# Patient Record
Sex: Male | Born: 1980 | Race: White | Hispanic: No
Health system: Western US, Academic
[De-identification: ages and names within clinical notes are randomized; demographics above are authoritative.]

## PROBLEM LIST (undated history)

## (undated) ENCOUNTER — Emergency Department (HOSPITAL_COMMUNITY): Admission: EM | Payer: PRIVATE HEALTH INSURANCE | Source: Home / Self Care

## (undated) DIAGNOSIS — K8689 Other specified diseases of pancreas: Secondary | ICD-10-CM

## (undated) DIAGNOSIS — I4891 Unspecified atrial fibrillation: Secondary | ICD-10-CM

## (undated) DIAGNOSIS — F32A Depression, unspecified: Secondary | ICD-10-CM

## (undated) DIAGNOSIS — F1010 Alcohol abuse, uncomplicated: Secondary | ICD-10-CM

## (undated) DIAGNOSIS — F329 Major depressive disorder, single episode, unspecified: Secondary | ICD-10-CM

## (undated) DIAGNOSIS — F419 Anxiety disorder, unspecified: Secondary | ICD-10-CM

## (undated) DIAGNOSIS — E785 Hyperlipidemia, unspecified: Secondary | ICD-10-CM

## (undated) DIAGNOSIS — G4730 Sleep apnea, unspecified: Secondary | ICD-10-CM

## (undated) DIAGNOSIS — K862 Cyst of pancreas: Secondary | ICD-10-CM

## (undated) DIAGNOSIS — I10 Essential (primary) hypertension: Secondary | ICD-10-CM

## (undated) DIAGNOSIS — M109 Gout, unspecified: Secondary | ICD-10-CM

## (undated) DIAGNOSIS — K863 Pseudocyst of pancreas: Secondary | ICD-10-CM

## (undated) HISTORY — DX: Gout, unspecified: M10.9

## (undated) HISTORY — DX: Essential (primary) hypertension: I10

## (undated) HISTORY — DX: Hyperlipidemia, unspecified: E78.5

## (undated) HISTORY — DX: Major depressive disorder, single episode, unspecified: F32.9

## (undated) HISTORY — DX: Sleep apnea, unspecified: G47.30

## (undated) HISTORY — DX: Depression, unspecified: F32.A

## (undated) HISTORY — PX: COLON SURGERY: SHX602

## (undated) DEATH — deceased

---
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
## 1999-06-30 ENCOUNTER — Encounter: Payer: Self-pay | Admitting: Emergency Medicine

## 1999-06-30 ENCOUNTER — Emergency Department (HOSPITAL_COMMUNITY): Admission: EM | Admit: 1999-06-30 | Discharge: 1999-06-30 | Payer: Self-pay | Admitting: Emergency Medicine

## 2000-12-08 ENCOUNTER — Emergency Department (HOSPITAL_COMMUNITY): Admission: EM | Admit: 2000-12-08 | Discharge: 2000-12-09 | Payer: Self-pay | Admitting: Emergency Medicine

## 2001-09-15 ENCOUNTER — Emergency Department (HOSPITAL_COMMUNITY): Admission: EM | Admit: 2001-09-15 | Discharge: 2001-09-15 | Payer: Self-pay

## 2001-09-26 ENCOUNTER — Emergency Department (HOSPITAL_COMMUNITY): Admission: EM | Admit: 2001-09-26 | Discharge: 2001-09-26 | Payer: Self-pay | Admitting: Emergency Medicine

## 2006-02-05 ENCOUNTER — Emergency Department (HOSPITAL_COMMUNITY): Admission: EM | Admit: 2006-02-05 | Discharge: 2006-02-05 | Payer: Self-pay | Admitting: Emergency Medicine

## 2006-10-15 ENCOUNTER — Emergency Department (HOSPITAL_COMMUNITY): Admission: EM | Admit: 2006-10-15 | Discharge: 2006-10-15 | Payer: Self-pay | Admitting: Emergency Medicine

## 2007-05-01 ENCOUNTER — Emergency Department (HOSPITAL_COMMUNITY): Admission: EM | Admit: 2007-05-01 | Discharge: 2007-05-01 | Payer: Self-pay | Admitting: Emergency Medicine

## 2007-06-19 ENCOUNTER — Emergency Department (HOSPITAL_COMMUNITY): Admission: EM | Admit: 2007-06-19 | Discharge: 2007-06-19 | Payer: Self-pay | Admitting: Emergency Medicine

## 2008-02-09 ENCOUNTER — Emergency Department (HOSPITAL_COMMUNITY): Admission: EM | Admit: 2008-02-09 | Discharge: 2008-02-10 | Payer: Self-pay | Admitting: Emergency Medicine

## 2008-08-22 ENCOUNTER — Emergency Department (HOSPITAL_COMMUNITY): Admission: EM | Admit: 2008-08-22 | Discharge: 2008-08-22 | Payer: Self-pay | Admitting: Emergency Medicine

## 2008-10-07 ENCOUNTER — Emergency Department (HOSPITAL_COMMUNITY): Admission: EM | Admit: 2008-10-07 | Discharge: 2008-10-07 | Payer: Self-pay | Admitting: Emergency Medicine

## 2008-11-16 ENCOUNTER — Emergency Department (HOSPITAL_COMMUNITY): Admission: EM | Admit: 2008-11-16 | Discharge: 2008-11-16 | Payer: Self-pay | Admitting: Family Medicine

## 2009-01-08 ENCOUNTER — Emergency Department (HOSPITAL_COMMUNITY): Admission: EM | Admit: 2009-01-08 | Discharge: 2009-01-08 | Payer: Self-pay | Admitting: Emergency Medicine

## 2010-08-08 ENCOUNTER — Ambulatory Visit: Payer: PRIVATE HEALTH INSURANCE | Attending: Otology & Neurotology | Admitting: Otology & Neurotology

## 2010-08-08 DIAGNOSIS — H6990 Unspecified Eustachian tube disorder, unspecified ear: Secondary | ICD-10-CM | POA: Insufficient documentation

## 2010-08-08 DIAGNOSIS — H905 Unspecified sensorineural hearing loss: Secondary | ICD-10-CM | POA: Insufficient documentation

## 2010-09-29 LAB — POCT I-STAT, CHEM 8
Creatinine, Ser: 1.1 mg/dL (ref 0.4–1.5)
HCT: 49 % (ref 39.0–52.0)
Hemoglobin: 16.7 g/dL (ref 13.0–17.0)
Potassium: 3.8 mEq/L (ref 3.5–5.1)
Sodium: 138 mEq/L (ref 135–145)
TCO2: 27 mmol/L (ref 0–100)

## 2010-10-02 LAB — DIFFERENTIAL
Basophils Relative: 0 % (ref 0–1)
Eosinophils Absolute: 0.1 10*3/uL (ref 0.0–0.7)
Eosinophils Relative: 1 % (ref 0–5)
Lymphs Abs: 2.5 10*3/uL (ref 0.7–4.0)
Monocytes Relative: 10 % (ref 3–12)
Neutrophils Relative %: 57 % (ref 43–77)

## 2010-10-02 LAB — COMPREHENSIVE METABOLIC PANEL
ALT: 20 U/L (ref 0–53)
AST: 24 U/L (ref 0–37)
Alkaline Phosphatase: 74 U/L (ref 39–117)
CO2: 28 mEq/L (ref 19–32)
Calcium: 9.4 mg/dL (ref 8.4–10.5)
Chloride: 103 mEq/L (ref 96–112)
GFR calc Af Amer: >60 mL/min (ref >60)
GFR calc non Af Amer: >60 mL/min (ref >60)
Glucose, Bld: 97 mg/dL (ref 70–99)
Potassium: 4.5 mEq/L (ref 3.5–5.1)
Sodium: 139 mEq/L (ref 135–145)

## 2010-10-02 LAB — MAGNESIUM: Magnesium: 2.4 mg/dL (ref 1.5–2.5)

## 2010-10-02 LAB — CBC
Hemoglobin: 16 g/dL (ref 13.0–17.0)
RBC: 5.13 MIL/uL (ref 4.22–5.81)
WBC: 7.8 10*3/uL (ref 4.0–10.5)

## 2010-10-10 ENCOUNTER — Ambulatory Visit (HOSPITAL_BASED_OUTPATIENT_CLINIC_OR_DEPARTMENT_OTHER): Payer: PRIVATE HEALTH INSURANCE

## 2010-10-10 ENCOUNTER — Ambulatory Visit: Payer: PRIVATE HEALTH INSURANCE | Attending: Physician Assistant | Admitting: Physician Assistant

## 2010-10-10 DIAGNOSIS — H6610 Chronic tubotympanic suppurative otitis media, unspecified: Secondary | ICD-10-CM | POA: Insufficient documentation

## 2010-10-10 DIAGNOSIS — Z01818 Encounter for other preprocedural examination: Secondary | ICD-10-CM | POA: Insufficient documentation

## 2010-10-23 ENCOUNTER — Ambulatory Visit
Admission: RE | Admit: 2010-10-23 | Discharge: 2010-10-23 | Disposition: A | Discharge status: Home / Self Care | Payer: PRIVATE HEALTH INSURANCE | Attending: Otology & Neurotology | Admitting: Otology & Neurotology

## 2010-10-23 ENCOUNTER — Ambulatory Visit (HOSPITAL_BASED_OUTPATIENT_CLINIC_OR_DEPARTMENT_OTHER): Payer: PRIVATE HEALTH INSURANCE | Admitting: Otology & Neurotology

## 2010-10-23 DIAGNOSIS — H902 Conductive hearing loss, unspecified: Secondary | ICD-10-CM | POA: Insufficient documentation

## 2010-10-23 DIAGNOSIS — H9500 Recurrent cholesteatoma of postmastoidectomy cavity, unspecified ear: Secondary | ICD-10-CM | POA: Insufficient documentation

## 2010-10-23 DIAGNOSIS — H73829 Atrophic nonflaccid tympanic membrane, unspecified ear: Secondary | ICD-10-CM | POA: Insufficient documentation

## 2010-10-23 DIAGNOSIS — J310 Chronic rhinitis: Secondary | ICD-10-CM | POA: Insufficient documentation

## 2010-10-31 ENCOUNTER — Ambulatory Visit: Payer: PRIVATE HEALTH INSURANCE | Attending: Otology & Neurotology | Admitting: Otology & Neurotology

## 2010-10-31 DIAGNOSIS — H902 Conductive hearing loss, unspecified: Secondary | ICD-10-CM | POA: Insufficient documentation

## 2010-10-31 DIAGNOSIS — H7190 Unspecified cholesteatoma, unspecified ear: Secondary | ICD-10-CM | POA: Insufficient documentation

## 2010-10-31 DIAGNOSIS — Z4801 Encounter for change or removal of surgical wound dressing: Secondary | ICD-10-CM | POA: Insufficient documentation

## 2010-12-19 ENCOUNTER — Ambulatory Visit: Payer: PRIVATE HEALTH INSURANCE | Attending: Otology & Neurotology | Admitting: Otology & Neurotology

## 2010-12-19 DIAGNOSIS — Z09 Encounter for follow-up examination after completed treatment for conditions other than malignant neoplasm: Secondary | ICD-10-CM | POA: Insufficient documentation

## 2010-12-19 DIAGNOSIS — H902 Conductive hearing loss, unspecified: Secondary | ICD-10-CM | POA: Insufficient documentation

## 2010-12-19 DIAGNOSIS — H7190 Unspecified cholesteatoma, unspecified ear: Secondary | ICD-10-CM | POA: Insufficient documentation

## 2011-06-24 DIAGNOSIS — F1010 Alcohol abuse, uncomplicated: Secondary | ICD-10-CM

## 2011-06-24 HISTORY — DX: Alcohol abuse, uncomplicated: F10.10

## 2011-06-27 ENCOUNTER — Ambulatory Visit: Payer: PRIVATE HEALTH INSURANCE | Attending: Otology & Neurotology

## 2011-06-27 DIAGNOSIS — H6500 Acute serous otitis media, unspecified ear: Secondary | ICD-10-CM | POA: Insufficient documentation

## 2011-07-03 ENCOUNTER — Ambulatory Visit: Payer: PRIVATE HEALTH INSURANCE | Attending: Otology & Neurotology | Admitting: Otology & Neurotology

## 2011-07-03 DIAGNOSIS — H7190 Unspecified cholesteatoma, unspecified ear: Secondary | ICD-10-CM | POA: Insufficient documentation

## 2011-07-03 DIAGNOSIS — H6990 Unspecified Eustachian tube disorder, unspecified ear: Secondary | ICD-10-CM | POA: Insufficient documentation

## 2011-07-03 DIAGNOSIS — H66009 Acute suppurative otitis media without spontaneous rupture of ear drum, unspecified ear: Secondary | ICD-10-CM | POA: Insufficient documentation

## 2011-08-18 DIAGNOSIS — F1010 Alcohol abuse, uncomplicated: Secondary | ICD-10-CM | POA: Insufficient documentation

## 2012-01-01 ENCOUNTER — Ambulatory Visit (HOSPITAL_BASED_OUTPATIENT_CLINIC_OR_DEPARTMENT_OTHER): Payer: PRIVATE HEALTH INSURANCE | Admitting: Otology & Neurotology

## 2012-09-16 ENCOUNTER — Ambulatory Visit (HOSPITAL_BASED_OUTPATIENT_CLINIC_OR_DEPARTMENT_OTHER): Payer: PRIVATE HEALTH INSURANCE | Admitting: Audiologist

## 2012-09-16 ENCOUNTER — Ambulatory Visit: Payer: PRIVATE HEALTH INSURANCE | Attending: Audiologist | Admitting: Otology & Neurotology

## 2012-09-16 DIAGNOSIS — H906 Mixed conductive and sensorineural hearing loss, bilateral: Secondary | ICD-10-CM | POA: Insufficient documentation

## 2012-09-16 DIAGNOSIS — H6520 Chronic serous otitis media, unspecified ear: Secondary | ICD-10-CM | POA: Insufficient documentation

## 2012-09-16 DIAGNOSIS — H6990 Unspecified Eustachian tube disorder, unspecified ear: Secondary | ICD-10-CM | POA: Insufficient documentation

## 2012-09-16 DIAGNOSIS — H7190 Unspecified cholesteatoma, unspecified ear: Secondary | ICD-10-CM | POA: Insufficient documentation

## 2012-09-21 DIAGNOSIS — K8689 Other specified diseases of pancreas: Secondary | ICD-10-CM

## 2012-09-21 HISTORY — DX: Other specified diseases of pancreas: K86.89

## 2012-11-04 ENCOUNTER — Encounter (HOSPITAL_BASED_OUTPATIENT_CLINIC_OR_DEPARTMENT_OTHER): Payer: PRIVATE HEALTH INSURANCE | Admitting: Otology & Neurotology

## 2012-11-18 ENCOUNTER — Ambulatory Visit: Payer: PRIVATE HEALTH INSURANCE | Attending: Otology & Neurotology | Admitting: Otology & Neurotology

## 2012-11-18 ENCOUNTER — Encounter (HOSPITAL_BASED_OUTPATIENT_CLINIC_OR_DEPARTMENT_OTHER): Payer: Self-pay | Admitting: Otology & Neurotology

## 2012-11-18 VITALS — BP 131/80 | HR 63

## 2012-11-18 DIAGNOSIS — H6990 Unspecified Eustachian tube disorder, unspecified ear: Secondary | ICD-10-CM | POA: Insufficient documentation

## 2012-11-18 DIAGNOSIS — H7190 Unspecified cholesteatoma, unspecified ear: Secondary | ICD-10-CM | POA: Insufficient documentation

## 2012-11-18 DIAGNOSIS — H6980 Other specified disorders of Eustachian tube, unspecified ear: Secondary | ICD-10-CM

## 2012-11-18 DIAGNOSIS — H7191 Unspecified cholesteatoma, right ear: Secondary | ICD-10-CM

## 2012-11-18 HISTORY — DX: Other specified disorders of Eustachian tube, unspecified ear: H69.80

## 2012-11-18 HISTORY — DX: Unspecified cholesteatoma, right ear: H71.91

## 2012-11-18 NOTE — Progress Notes (Signed)
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 Recalled that he underwent a right tympanomastoidectomy with cartilage graft reconstruction in May of 2012 for incipient cholesteatoma. He was most recently seen by me in March of 2014. He was doing relatively well but was having some fullness of his left ear and on exam had some recurrence of an effusion with bubbles.  We had recommended restarting an aggressive allergy regimen to see if we could avoid a tube in his left ear. His right ear has remained stable and feels that the hearing is good. He is observing moderate dry ear precautions. He is not using anything to clean his ears.  He notes that his left ear is doing better, but seems to plug up when he sleeps left side down.    Pertinent Review of systems:  Pt denies: shortness of breath, palpitations, sweating, weakness, headache, visual changes, cough, fever, chills, weight loss, dysphagia, odynophagia, facial weakness, numbness, or voice changes.      Outpatient Prescriptions Marked as Taking for the 11/18/12 encounter (Office Visit) with Dawayne Patricia   Medication Sig Dispense Refill   . Fluticasone Furoate (VERAMYST) 27.5 MCG/SPRAY Nasal Suspension 1-2 spray(s) Nostril-Both Daily         No Facility-Administered Medications for the 11/18/12 encounter (Office Visit) with Dawayne Patricia.       Allergies  Review of patient's allergies indicates no known allergies.      Exam:  General appearance: healthy, alert, no distress, gait normal, voice normal, alert and oriented  Eyes: Lids/periorbital skin normal, Conjunctivae/corneas clear, PERRL, EOM's intact, no nystagmus    Microscopic ear exam:   Right external ear, ear canal normal, TM - with intact T tube, patent. No infection or retractions  Left external ear, ear canal, TM - intact and middle ear aerated. No effusion.  Dix Hallpike  normal    Nose/sinus exam: Nares normal. Septum midline. Mucosa normal. No drainage or sinus tenderness.  Oropharynx: normal, floor of mouth soft with no masses.     Neuro: cranial nerves 2-12 intact  Skin: Color, texture, turgor normal. No rashes or concerning lesions      Assessment:   Doing well with improvement in hearing and fullness in left ear on medical management. Right ear stable.   Will contact clinic if hearing in left ear declines and wants to have left ear tube.    F/u six  Months.

## 2012-12-14 ENCOUNTER — Other Ambulatory Visit: Payer: Self-pay | Admitting: Otolaryngology

## 2012-12-16 MED ORDER — FLUTICASONE FUROATE 27.5 MCG/SPRAY NA SUSP
NASAL | Status: DC
Start: 2012-12-14 — End: 2013-07-07

## 2012-12-16 NOTE — Telephone Encounter (Signed)
Last seen May 2014; to RTC .  Appt pending 05/12/13.

## 2013-01-26 DIAGNOSIS — E876 Hypokalemia: Secondary | ICD-10-CM | POA: Insufficient documentation

## 2013-04-04 ENCOUNTER — Encounter (HOSPITAL_COMMUNITY): Payer: Self-pay | Admitting: Emergency Medicine

## 2013-04-04 ENCOUNTER — Emergency Department (HOSPITAL_COMMUNITY)
Admission: EM | Admit: 2013-04-04 | Discharge: 2013-04-05 | Disposition: A | Discharge status: Home / Self Care | Payer: Self-pay | Attending: Emergency Medicine | Admitting: Emergency Medicine

## 2013-04-04 DIAGNOSIS — F1010 Alcohol abuse, uncomplicated: Secondary | ICD-10-CM

## 2013-04-04 DIAGNOSIS — F17200 Nicotine dependence, unspecified, uncomplicated: Secondary | ICD-10-CM | POA: Insufficient documentation

## 2013-04-04 DIAGNOSIS — R259 Unspecified abnormal involuntary movements: Secondary | ICD-10-CM | POA: Insufficient documentation

## 2013-04-04 DIAGNOSIS — F411 Generalized anxiety disorder: Secondary | ICD-10-CM | POA: Insufficient documentation

## 2013-04-04 DIAGNOSIS — I4891 Unspecified atrial fibrillation: Secondary | ICD-10-CM | POA: Insufficient documentation

## 2013-04-04 DIAGNOSIS — R000 Tachycardia, unspecified: Secondary | ICD-10-CM | POA: Insufficient documentation

## 2013-04-04 DIAGNOSIS — R110 Nausea: Secondary | ICD-10-CM | POA: Insufficient documentation

## 2013-04-04 DIAGNOSIS — F1020 Alcohol dependence, uncomplicated: Secondary | ICD-10-CM | POA: Insufficient documentation

## 2013-04-04 DIAGNOSIS — R002 Palpitations: Secondary | ICD-10-CM | POA: Insufficient documentation

## 2013-04-04 HISTORY — DX: Unspecified atrial fibrillation: I48.91

## 2013-04-04 LAB — CBC
HCT: 46.9 % (ref 39.0–52.0)
MCHC: 35.8 g/dL (ref 30.0–36.0)
MCV: 91.8 fL (ref 78.0–100.0)
Platelets: 198 10*3/uL (ref 150–400)
RDW: 13.9 % (ref 11.5–15.5)
WBC: 10.9 10*3/uL — ABNORMAL HIGH (ref 4.0–10.5)

## 2013-04-04 LAB — COMPREHENSIVE METABOLIC PANEL
AST: 134 U/L — ABNORMAL HIGH (ref 0–37)
Albumin: 4.5 g/dL (ref 3.5–5.2)
BUN: 8 mg/dL (ref 6–23)
Creatinine, Ser: 0.65 mg/dL (ref 0.50–1.35)
Total Protein: 8.4 g/dL — ABNORMAL HIGH (ref 6.0–8.3)

## 2013-04-04 LAB — POCT I-STAT TROPONIN I: Troponin i, poc: 0 ng/mL (ref 0.00–0.08)

## 2013-04-04 LAB — ETHANOL: Alcohol, Ethyl (B): 14 mg/dL — ABNORMAL HIGH (ref 0–11)

## 2013-04-04 MED ORDER — LORAZEPAM 1 MG PO TABS: 1.0000 mg | ORAL_TABLET | Freq: Four times a day (QID) | ORAL | Status: DC | PRN

## 2013-04-04 MED ORDER — LORAZEPAM 2 MG/ML IJ SOLN
1.0000 mg | Freq: Four times a day (QID) | INTRAMUSCULAR | Status: DC | PRN
  Administered 2013-04-04 (×2): 1 mg via INTRAVENOUS
  Filled 2013-04-04 (×2): qty 1

## 2013-04-04 MED ORDER — SODIUM CHLORIDE 0.9 % IV BOLUS (SEPSIS)
2000.0000 mL | Freq: Once | INTRAVENOUS | Status: AC
  Administered 2013-04-04: 2000 mL via INTRAVENOUS

## 2013-04-04 MED ORDER — THIAMINE HCL 100 MG/ML IJ SOLN
100.0000 mg | Freq: Every day | INTRAMUSCULAR | Status: DC
  Administered 2013-04-04: 100 mg via INTRAVENOUS
  Filled 2013-04-04: qty 2

## 2013-04-04 MED ORDER — VITAMIN B-1 100 MG PO TABS: 100.0000 mg | ORAL_TABLET | Freq: Every day | ORAL | Status: DC

## 2013-04-04 MED ORDER — NICOTINE 21 MG/24HR TD PT24: 21.0000 mg | MEDICATED_PATCH | Freq: Every day | TRANSDERMAL | Status: DC

## 2013-04-04 MED ORDER — ONDANSETRON HCL 4 MG/2ML IJ SOLN
4.0000 mg | Freq: Once | INTRAMUSCULAR | Status: AC
  Administered 2013-04-04: 4 mg via INTRAVENOUS
  Filled 2013-04-04: qty 2

## 2013-04-04 MED ORDER — LORAZEPAM 1 MG PO TABS: 0.0000 mg | ORAL_TABLET | Freq: Two times a day (BID) | ORAL | Status: DC

## 2013-04-04 MED ORDER — LORAZEPAM 1 MG PO TABS
0.0000 mg | ORAL_TABLET | Freq: Four times a day (QID) | ORAL | Status: DC
  Administered 2013-04-04 (×2): 2 mg via ORAL
  Filled 2013-04-04 (×2): qty 2

## 2013-04-04 MED ORDER — FOLIC ACID 1 MG PO TABS
1.0000 mg | ORAL_TABLET | Freq: Every day | ORAL | Status: DC
  Administered 2013-04-04: 1 mg via ORAL
  Filled 2013-04-04: qty 1

## 2013-04-04 MED ORDER — ADULT MULTIVITAMIN W/MINERALS CH
1.0000 | ORAL_TABLET | Freq: Every day | ORAL | Status: DC
  Administered 2013-04-04: 1 via ORAL
  Filled 2013-04-04: qty 1

## 2013-04-04 NOTE — ED Notes (Addendum)
Pt reports he just got a new job where he had to cut down on drinking every night, pt sts for the past 3 months he has been drinking ETOH about 5-6 liquor drinks a night. Pt sts last drink of ETOH was at 1300 today, then still didn't feel good so he has a beer a little after that. Pt sts he feels nauseous and sometime sweaty after doing this and it lingers into the next day also. Pt sts he also feels "shakey", pt sts it all started about 3 days ago and has progressively gotten worse. Reports vomiting and diarrhea yesterday. sts last night he stopped drinking all together and now just feels even worse. Pt sts he has hx of going into a.fib and/or SVT when he gets nauseous and vomits. Pt sts he does want detox. Pt in nad, skin warm and dry, resp e/u.

## 2013-04-04 NOTE — ED Notes (Signed)
Pt.'s personal belongings inventoried by NT and locked for safe keeping . Valuables given to security .

## 2013-04-04 NOTE — BH Assessment (Signed)
[facility] Assessment Progress Note      Notified Dr Gillermo Murdoch that pt has been seen and there are currently no appropriate beds at [facility].  WIll attempt to place pt elsewhere as he awaits bed availability here.

## 2013-04-04 NOTE — ED Notes (Signed)
Pt made aware of need for urine sample.  

## 2013-04-04 NOTE — BH Assessment (Signed)
Tele Assessment Note   Alejandro Flores is an 32 y.o. male who presents seeking detox from alcohol.  He reports that he wants to stop drinking because "it's a young man's game."  He wanted to stop drinking on his own, but noticed he was starting to feel poorly so he came to the hospital.  He states he experienced some afib once before in 2-12so he was admitted to the hospital and they thought that it may have been related to stopping drinking on his own, so they detoxed him while treating the condition at the same time.  He denies any thoughts of suicide, now or in the last six months, or homicide, now or in the last six months, and has never experienced AVH or delusional thoughts.  He denies any history of violence.  He does endorse some depression in the last 3 mos related to the separation from his wife, but currently feels that he's doing pretty well.  He is calm, cooperative, and motivated for treatment.   Axis I: Substance Abuse and alcohol Axis II: Deferred Axis III:  Past Medical History  Diagnosis Date  . Atrial fibrillation    Axis IV: problems with primary support group Axis V: 41-50 serious symptoms  Past Medical History:  Past Medical History  Diagnosis Date  . Atrial fibrillation     History reviewed. No pertinent past surgical history.  Family History: History reviewed. No pertinent family history.  Social History:  reports that he has been smoking.  He does not have any smokeless tobacco history on file. He reports that he drinks alcohol. He reports that he does not use illicit drugs.  Additional Social History:  Alcohol / Drug Use History of alcohol / drug use?: Yes Longest period of sobriety (when/how long): 4.5 years Negative Consequences of Use: Personal relationships;Work / Mining engineer #1 Name of Substance 1: Alcohol-Vodka and orange juice 1 - Age of First Use: 16 1 - Amount (size/oz): 3-6 drinks 1 - Frequency: daily 1 - Duration: 3 + mos 1 - Last Use / Amount:  10/13 am  CIWA: CIWA-Ar BP: 134/85 mmHg Pulse Rate: 103 Nausea and Vomiting: no nausea and no vomiting Tactile Disturbances: none Tremor: two Auditory Disturbances: not present Paroxysmal Sweats: two Visual Disturbances: not present Anxiety: three Headache, Fullness in Head: very mild Agitation: two Orientation and Clouding of Sensorium: oriented and can do serial additions CIWA-Ar Total: 10 COWS:    Allergies: No Known Allergies  Home Medications:  (Not in a hospital admission)  OB/GYN Status:  No LMP for male patient.  General Assessment Data Location of Assessment: [facility] ED Is this a Tele or Face-to-Face Assessment?: Tele Assessment Is this an Initial Assessment or a Re-assessment for this encounter?: Initial Assessment Living Arrangements: Parent Can pt return to current living arrangement?: Yes Admission Status: Voluntary Is patient capable of signing voluntary admission?: Yes Transfer from: Acute Hospital Referral Source: Self/Family/Friend     [facility] Crisis Care Plan Living Arrangements: Parent  Education Status Is patient currently in school?: No Highest grade of school patient has completed: associate of arts some culinary school  Risk to self Suicidal Ideation: No Suicidal Intent: No Is patient at risk for suicide?: No Suicidal Plan?: No Access to Means: No What has been your use of drugs/alcohol within the last 12 months?: drinking daily Previous Attempts/Gestures: No How many times?: 0 Intentional Self Injurious Behavior: None Family Suicide History: No Recent stressful life event(s): Loss (Comment) (separation from spouse) Persecutory voices/beliefs?: No Depression:  No Substance abuse history and/or treatment for substance abuse?: Yes Suicide prevention information given to non-admitted patients: Not applicable  Risk to Others Homicidal Ideation: No Thoughts of Harm to Others: No Current Homicidal Intent: No Current Homicidal Plan: No Access  to Homicidal Means: No History of harm to others?: No Assessment of Violence: None Noted Does patient have access to weapons?: No Criminal Charges Pending?: No Does patient have a court date: No  Psychosis Hallucinations: None noted Delusions: None noted  Mental Status Report Appear/Hygiene: Other (Comment) (unremarkable) Eye Contact: Good Motor Activity: Mannerisms Speech: Logical/coherent Level of Consciousness: Alert Mood: Ambivalent Affect: Appropriate to circumstance Anxiety Level: Panic Attacks Panic attack frequency: varies Most recent panic attack: 3 mos agowhen wife left Thought Processes: Coherent;Relevant Judgement: Unimpaired Orientation: Person;Place;Time;Situation Obsessive Compulsive Thoughts/Behaviors: Minimal  Cognitive Functioning Concentration: Decreased Memory: Recent Intact;Remote Intact IQ: Average Insight: Good Impulse Control: Good Appetite: Good Weight Loss: 0 Weight Gain: 0 Sleep: Decreased Total Hours of Sleep: 4 Vegetative Symptoms: None  ADLScreening [facility] Assessment Services) Patient's cognitive ability adequate to safely complete daily activities?: Yes Patient able to express need for assistance with ADLs?: Yes Independently performs ADLs?: Yes (appropriate for developmental age)  Prior Inpatient Therapy Prior Inpatient Therapy: No  Prior Outpatient Therapy Prior Outpatient Therapy: Yes Prior Therapy Dates: can't remember Reason for Treatment: for anxiety  ADL Screening (condition at time of admission) Patient's cognitive ability adequate to safely complete daily activities?: Yes Patient able to express need for assistance with ADLs?: Yes Independently performs ADLs?: Yes (appropriate for developmental age)       Abuse/Neglect Assessment (Assessment to be complete while patient is alone) Physical Abuse: Denies Verbal Abuse: Denies Sexual Abuse: Denies Exploitation of patient/patient's resources: Denies Values /  Beliefs Cultural Requests During Hospitalization: None Spiritual Requests During Hospitalization: None   Advance Directives (For Healthcare) Advance Directive: Patient has advance directive, copy not in chart Type of Advance Directive: Healthcare Power of Attorney Nutrition Screen- [facility] Adult/WL/AP Patient's home diet: Regular  Additional Information 1:1 In Past 12 Months?: No CIRT Risk: No Elopement Risk: No Does patient have medical clearance?: Yes     Disposition:  Disposition Initial Assessment Completed for this Encounter: Yes Disposition of Patient: Inpatient treatment program Type of inpatient treatment program: Adult  Steward Ros 04/04/2013 8:50 PM

## 2013-04-04 NOTE — ED Notes (Signed)
Pt given gown and instructed to get undressed and into gown.

## 2013-04-04 NOTE — ED Provider Notes (Signed)
CSN: [id number]     Arrival date & time 04/04/13  1445 History   First MD Initiated Contact with Patient 04/04/13 1538     Chief Complaint  Patient presents with  . Alcohol Problem  . Nausea   (Consider location/radiation/quality/duration/timing/severity/associated sxs/prior Treatment) HPI Patient reports complains of nausea and feeling as if he is withdrawing from alcohol. He feels shaky since this morning. He last drank alcohol 1 PM today. No treatment prior to coming here. No pain anywhere. Nothing makes symptoms better or worse. He feels his heart racing is a taking maybe an atrial fibrillation which occurs when he becomes nauseated as a result of alcohol withdrawal. Past Medical History  Diagnosis Date  . Atrial fibrillation    alcohol abuse History reviewed. No pertinent past surgical history. History reviewed. No pertinent family history. History  Substance Use Topics  . Smoking status: Current Every Day Smoker  . Smokeless tobacco: Not on file  . Alcohol Use: Yes    no illicit drug use Review of Systems  Constitutional: Negative.   HENT: Negative.   Respiratory: Negative.   Cardiovascular: Positive for palpitations.  Gastrointestinal: Positive for nausea.  Musculoskeletal: Negative.   Skin: Negative.   Neurological: Negative.   Psychiatric/Behavioral: Negative.        Anxious  All other systems reviewed and are negative.    Allergies  Review of patient's allergies indicates no known allergies.  Home Medications  No current outpatient prescriptions on file. BP 170/100  Pulse 110  Temp(Src) 98.2 F (36.8 C) (Oral)  Resp 18  Ht 5\' 10"  (1.778 m)  Wt 181 lb 3.2 oz (82.192 kg)  BMI 26 kg/m2  SpO2 100% Physical Exam  Nursing note and vitals reviewed. Constitutional: He appears well-developed and well-nourished.  Anxious appearing, mildly tremulous  HENT:  Head: Normocephalic and atraumatic.  Eyes: Conjunctivae are normal. Pupils are equal, round, and  reactive to light.  Neck: Neck supple. No tracheal deviation present. No thyromegaly present.  Cardiovascular: Regular rhythm.   No murmur heard. Tachycardic  Pulmonary/Chest: Effort normal and breath sounds normal.  Abdominal: Soft. Bowel sounds are normal. He exhibits no distension. There is no tenderness.  Musculoskeletal: Normal range of motion. He exhibits no edema and no tenderness.  Neurological: He is alert. Coordination normal.  Skin: Skin is warm and dry. No rash noted.  Psychiatric: He has a normal mood and affect.    ED Course  Procedures (including critical care time) Labs Review Labs Reviewed  CBC  COMPREHENSIVE METABOLIC PANEL  ETHANOL   Imaging Review No results found.  Date: 04/04/2013  Rate: 125  Rhythm: sinus tachycardia  QRS Axis: normal  Intervals: normal  ST/T Wave abnormalities: normal  Conduction Disutrbances:none  Narrative Interpretation:   Old EKG Reviewed: Rate increase since 01/08/2009 otherwise no significant change interpreted by me 6:25 PM feels improved after treatment with benzodiazepines and intravenous fluids. Patient ate a meal while here. EKG Interpretation   None      Results for orders placed during the hospital encounter of 04/04/13  CBC      Result Value Range   WBC 10.9 (*) 4.0 - 10.5 K/uL   RBC 5.11  4.22 - 5.81 MIL/uL   Hemoglobin 16.8  13.0 - 17.0 g/dL   HCT 40.9  81.1 - 91.4 %   MCV 91.8  78.0 - 100.0 fL   MCH 32.9  26.0 - 34.0 pg   MCHC 35.8  30.0 - 36.0 g/dL   RDW 13.9  11.5 - 15.5 %   Platelets 198  150 - 400 K/uL  COMPREHENSIVE METABOLIC PANEL      Result Value Range   Sodium 137  135 - 145 mEq/L   Potassium 3.9  3.5 - 5.1 mEq/L   Chloride 94 (*) 96 - 112 mEq/L   CO2 20  19 - 32 mEq/L   Glucose, Bld 92  70 - 99 mg/dL   BUN 8  6 - 23 mg/dL   Creatinine, Ser 1.19  0.50 - 1.35 mg/dL   Calcium 9.6  8.4 - 14.7 mg/dL   Total Protein 8.4 (*) 6.0 - 8.3 g/dL   Albumin 4.5  3.5 - 5.2 g/dL   AST 829 (*) 0 - 37 U/L    ALT 142 (*) 0 - 53 U/L   Alkaline Phosphatase 105  39 - 117 U/L   Total Bilirubin 1.2  0.3 - 1.2 mg/dL   GFR calc non Af Amer >90  >90 mL/min   GFR calc Af Amer >90  >90 mL/min  ETHANOL      Result Value Range   Alcohol, Ethyl (B) 14 (*) 0 - 11 mg/dL  POCT I-STAT TROPONIN I      Result Value Range   Troponin i, poc 0.00  0.00 - 0.08 ng/mL   Comment 3            No results found.   MDM  No diagnosis found.  CIWA protocol ordered Patient says by [facility]. They are attempting to find him in patient facility for detox from alcohol. Diagnosis chronic alcohol abuse    Doug Sou, MD 04/04/13 2357

## 2013-04-04 NOTE — ED Notes (Signed)
Pt c/o shakiness and not feeling well; pt sts normally drinks heavily but sts quit drinking 3 days ago; pt sts feels shaky and has hx of afib associated with alcohol consumption and vomiting

## 2013-04-05 LAB — RAPID URINE DRUG SCREEN, HOSP PERFORMED
Amphetamines: NOT DETECTED
Benzodiazepines: NOT DETECTED
Cocaine: NOT DETECTED
Opiates: NOT DETECTED
Tetrahydrocannabinol: NOT DETECTED

## 2013-04-05 MED ORDER — ONDANSETRON 8 MG PO TBDP: 8.0000 mg | ORAL_TABLET | Freq: Three times a day (TID) | ORAL | Status: DC | PRN

## 2013-04-05 NOTE — ED Provider Notes (Signed)
Pt has been accepted to [facility], but now does not want to go to detox.  Requesting d/c  Olivia Mackie, MD 04/05/13 [phone]

## 2013-04-05 NOTE — Progress Notes (Signed)
B.Deyjah Kindel, MHT conducted placement search for patient who is in need of detox from alcohol. Writer contacted [facility] spoke with intake coordinator who reports possible detox bed and to fax for review, referral faxed. Writer contacted [facility] spoke with Liborio Nixon who reports availability and to fax for review. Writer faxed referral inclusive of [facility] referral form, labs, and assessment from clinician and attending physician Dr.Jacubowitz HPI. At 4:55 am Harriett Sine from [facility] reports acceptance for detox by Dr. Omelia Blackwater. Patient can be transferred to [facility] this am after morning shift change between 7:30 am and 8am. Attending nurse Yvonne Kendall has been notified of disposition and will arrange for patient transfer via [employer] this am. Number to give report at [facility] [phone]

## 2013-04-05 NOTE — ED Notes (Signed)
Dr. Norlene Campbell notified that the pt. decided not to go to [facility] and wishes to go home , discharge papers and prescription for Zofran given . Personal belongings and valuables given to pt.

## 2013-05-12 ENCOUNTER — Encounter (HOSPITAL_BASED_OUTPATIENT_CLINIC_OR_DEPARTMENT_OTHER): Payer: PRIVATE HEALTH INSURANCE | Admitting: Otology & Neurotology

## 2013-07-07 ENCOUNTER — Ambulatory Visit
Payer: No Typology Code available for payment source | Attending: Otology & Neurotology | Admitting: Otology & Neurotology

## 2013-07-07 ENCOUNTER — Encounter (HOSPITAL_BASED_OUTPATIENT_CLINIC_OR_DEPARTMENT_OTHER): Payer: Self-pay | Admitting: Otology & Neurotology

## 2013-07-07 VITALS — BP 128/78 | HR 62

## 2013-07-07 DIAGNOSIS — H6981 Other specified disorders of Eustachian tube, right ear: Secondary | ICD-10-CM

## 2013-07-07 DIAGNOSIS — H7190 Unspecified cholesteatoma, unspecified ear: Secondary | ICD-10-CM

## 2013-07-07 DIAGNOSIS — H7191 Unspecified cholesteatoma, right ear: Secondary | ICD-10-CM

## 2013-07-07 DIAGNOSIS — Z760 Encounter for issue of repeat prescription: Secondary | ICD-10-CM | POA: Insufficient documentation

## 2013-07-07 DIAGNOSIS — H9313 Tinnitus, bilateral: Secondary | ICD-10-CM

## 2013-07-07 DIAGNOSIS — H6990 Unspecified Eustachian tube disorder, unspecified ear: Secondary | ICD-10-CM | POA: Insufficient documentation

## 2013-07-07 DIAGNOSIS — H6980 Other specified disorders of Eustachian tube, unspecified ear: Secondary | ICD-10-CM | POA: Insufficient documentation

## 2013-07-07 DIAGNOSIS — H906 Mixed conductive and sensorineural hearing loss, bilateral: Secondary | ICD-10-CM

## 2013-07-07 DIAGNOSIS — H9319 Tinnitus, unspecified ear: Secondary | ICD-10-CM

## 2013-07-07 MED ORDER — FLUTICASONE FUROATE 27.5 MCG/SPRAY NA SUSP
2.0000 | Freq: Every day | NASAL | Status: AC
Start: 2013-07-07 — End: ?

## 2013-07-07 MED ORDER — OFLOXACIN 0.3 % OT SOLN
5.0000 [drp] | Freq: Two times a day (BID) | OTIC | Status: DC
Start: 2013-07-07 — End: 2018-07-14

## 2013-07-07 NOTE — Progress Notes (Signed)
CC:  Ear Problem      Warren Dixon is a 33 year old year old male seen today for follow up of  of chronic bilateral middle ear disease and eustachian tube dysfunction. Recalled that he underwent a right tympanomastoidectomy with cartilage graft reconstruction in May of 2012 for incipient cholesteatoma. He was most recently seen by me in May of 2014.  Left ear doing well without tube. Right ear with some fullness, feels like something rattling.Marland Kitchen. He is not using anything to clean his ears.      Pertinent Review of systems:  Pt denies: shortness of breath, palpitations, sweating, weakness, headache, visual changes, cough, fever, chills, weight loss, dysphagia, odynophagia, facial weakness, numbness, or voice changes.      Outpatient Prescriptions Marked as Taking for the 07/07/13 encounter (Office Visit) with Dawayne PatriciaHume, Clifford Robert   Medication Sig Dispense Refill   . Fluticasone Furoate (VERAMYST) 27.5 MCG/SPRAY Nasal Suspension Spray 2 sprays into each nostril daily.  1 Inhaler  5     No Facility-Administered Medications for the 07/07/13 encounter (Office Visit) with Dawayne PatriciaHume, Clifford Robert.       Allergies  Review of patient's allergies indicates no known allergies.      Exam:  General appearance: healthy, alert, no distress, lower leg splint, gait relatively normal, voice normal, alert and oriented    Eyes: Lids/periorbital skin normal, Conjunctivae/corneas clear, PERRL, EOM's intact, no nystagmus    Microscopic ear exam:   Right external ear, ear canal normal, TM - with intact T tube, patent with dry debris surrounding. Unable to remove without dislodging tube. No infection or retractions  Left external ear, ear canal, TM - intact and middle ear aerated. No effusion.    Dix Hallpike normal    Nose/sinus exam: Nares normal. Septum midline. Mucosa normal. No drainage or sinus tenderness.  Oropharynx: normal, floor of mouth soft with no masses.     Neuro: cranial nerves 2-12 intact  Skin: Color, texture, turgor normal. No  rashes or concerning lesions      Assessment:   Doing well with improvement in hearing and fullness in left ear on medical management. Right ear stable, but some dry debris around tube. Will start one bottle of ear drops to try and clear.     Will contact clinic if hearing in left ear declines and wants to have left ear tube.    F/u six  Months.  Refilled veramyst and ofloxin drops.    I, Clifford R. Hume,  spent fifteen minutes of a twenty minute visit personally counseling Warren Dixon  related to the issues outlined above.

## 2013-08-30 ENCOUNTER — Telehealth (HOSPITAL_BASED_OUTPATIENT_CLINIC_OR_DEPARTMENT_OTHER): Payer: Self-pay | Admitting: Otology & Neurotology

## 2013-08-30 NOTE — Telephone Encounter (Signed)
Left a message for patient to call me directly

## 2013-08-30 NOTE — Telephone Encounter (Signed)
Spoke to patient and he stated he recently had a cold which could have contributed to his fullness in his left ear.

## 2013-08-30 NOTE — Telephone Encounter (Signed)
CONFIRMED PHONE NUMBER: [phone]  CALLERS FIRST AND LAST NAME: Gregary SignsSean  FACILITY NAME: n/a TITLE: n/a  CALLERS RELATIONSHIP:Self  RETURN CALL: General message OK     SUBJECT: Appointment Request   REASON FOR REQUEST: left ear infection    REQUEST APPOINTMENT WITH: Dr Berkley HarveyHume, or an associate  REFERRING PROVIDER: n/a  REQUESTED DATE: 09/01/13  REQUESTED TIME: Morning preferred  UNABLE TO APPOINT: Other: Dr. Berkley HarveyHume is not available on the date requested. Per SOP, sending TE when requesting a different provider. CCR confirmed that this is not related to his implant (infection is in opposite ear of implant). Patient has not seen his PCP for this issue. Please advise.

## 2013-09-01 ENCOUNTER — Ambulatory Visit
Payer: No Typology Code available for payment source | Attending: Otology & Neurotology | Admitting: Otology & Neurotology

## 2013-09-01 ENCOUNTER — Encounter (HOSPITAL_BASED_OUTPATIENT_CLINIC_OR_DEPARTMENT_OTHER): Payer: Self-pay | Admitting: Otology & Neurotology

## 2013-09-01 VITALS — BP 136/81 | HR 80

## 2013-09-01 DIAGNOSIS — H7190 Unspecified cholesteatoma, unspecified ear: Secondary | ICD-10-CM

## 2013-09-01 DIAGNOSIS — H906 Mixed conductive and sensorineural hearing loss, bilateral: Secondary | ICD-10-CM | POA: Insufficient documentation

## 2013-09-01 DIAGNOSIS — H6502 Acute serous otitis media, left ear: Secondary | ICD-10-CM | POA: Insufficient documentation

## 2013-09-01 DIAGNOSIS — H7191 Unspecified cholesteatoma, right ear: Secondary | ICD-10-CM

## 2013-09-01 DIAGNOSIS — H6980 Other specified disorders of Eustachian tube, unspecified ear: Secondary | ICD-10-CM

## 2013-09-01 DIAGNOSIS — H6500 Acute serous otitis media, unspecified ear: Secondary | ICD-10-CM | POA: Insufficient documentation

## 2013-09-01 DIAGNOSIS — H6983 Other specified disorders of Eustachian tube, bilateral: Secondary | ICD-10-CM

## 2013-09-01 DIAGNOSIS — H6990 Unspecified Eustachian tube disorder, unspecified ear: Secondary | ICD-10-CM | POA: Insufficient documentation

## 2013-09-01 NOTE — Progress Notes (Signed)
CC:  Ear Problem      Warren Dixon is a 33 year old year old male seen today for follow up of  of chronic bilateral middle ear disease and eustachian tube dysfunction. Recalled that he underwent a right tympanomastoidectomy with cartilage graft reconstruction in May of 2012 for incipient cholesteatoma. He still has a right T tube. He was most recently seen by me in January 2015.  Left ear was doing well without a tube, but had cold starting about 2 weeks ago and hearing dropped. Cold gradually improving, but ear still full with decreased hearing..      Pertinent Review of systems:  Pt denies: shortness of breath, palpitations, sweating, weakness, headache, visual changes, cough, fever, chills, weight loss, dysphagia, odynophagia, facial weakness, numbness, or voice changes.      Outpatient Prescriptions Marked as Taking for the 09/01/13 encounter (Office Visit) with Dawayne PatriciaHume, Warren Robert   Medication Sig Dispense Refill   . Fluticasone Furoate (VERAMYST) 27.5 MCG/SPRAY Nasal Suspension Spray 2 sprays into each nostril daily.  1 Inhaler  5     No Facility-Administered Medications for the 09/01/13 encounter (Office Visit) with Dawayne PatriciaHume, Warren Robert.       Allergies  Review of patient's allergies indicates no known allergies.      Exam:  General appearance: healthy, alert, no distress, gait normal, voice slightly hoarse, alert and oriented    Eyes: Lids/periorbital skin normal, Conjunctivae/corneas clear, PERRL, EOM's intact, no nystagmus    Microscopic ear exam:   Right external ear, ear canal normal, TM - with intact T tube, patent with dry debris surrounding. Unable to remove without dislodging tube. No infection or retractions    Left external ear, ear canal, TM - slightly retracted with yellow serous effusion.     Dix Hallpike normal    Nose/sinus exam: Nares normal. Septum midline. Mucosa normal. No drainage or sinus tenderness.  Oropharynx: normal, floor of mouth soft with no masses.     Neuro: cranial nerves 2-12  intact  Skin: Color, texture, turgor normal. 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 Rec restart an OTC antihistamine and try auto insufflation. Still using Veramyst.   If effusion fails to clear, we have f/u in one month with new audio and consider replacement of tube.       I, Warren R. Dixon,  spent fifteen minutes of a twenty minute visit personally counseling Warren Dixon  related to the issues outlined above.

## 2013-09-29 ENCOUNTER — Ambulatory Visit (HOSPITAL_BASED_OUTPATIENT_CLINIC_OR_DEPARTMENT_OTHER): Payer: No Typology Code available for payment source | Admitting: Audiologist

## 2013-09-29 ENCOUNTER — Ambulatory Visit
Payer: No Typology Code available for payment source | Attending: Otology & Neurotology | Admitting: Otology & Neurotology

## 2013-09-29 ENCOUNTER — Encounter (HOSPITAL_BASED_OUTPATIENT_CLINIC_OR_DEPARTMENT_OTHER): Payer: Self-pay | Admitting: Otology & Neurotology

## 2013-09-29 VITALS — BP 125/84 | HR 62

## 2013-09-29 DIAGNOSIS — H6520 Chronic serous otitis media, unspecified ear: Secondary | ICD-10-CM

## 2013-09-29 DIAGNOSIS — H6522 Chronic serous otitis media, left ear: Secondary | ICD-10-CM | POA: Insufficient documentation

## 2013-09-29 DIAGNOSIS — H906 Mixed conductive and sensorineural hearing loss, bilateral: Secondary | ICD-10-CM

## 2013-09-29 DIAGNOSIS — H7190 Unspecified cholesteatoma, unspecified ear: Secondary | ICD-10-CM | POA: Insufficient documentation

## 2013-09-29 DIAGNOSIS — H6502 Acute serous otitis media, left ear: Secondary | ICD-10-CM

## 2013-09-29 DIAGNOSIS — H6990 Unspecified Eustachian tube disorder, unspecified ear: Secondary | ICD-10-CM | POA: Insufficient documentation

## 2013-09-29 DIAGNOSIS — H6980 Other specified disorders of Eustachian tube, unspecified ear: Secondary | ICD-10-CM

## 2013-09-29 DIAGNOSIS — H6500 Acute serous otitis media, unspecified ear: Secondary | ICD-10-CM

## 2013-09-29 DIAGNOSIS — H7191 Unspecified cholesteatoma, right ear: Secondary | ICD-10-CM

## 2013-09-29 DIAGNOSIS — H6983 Other specified disorders of Eustachian tube, bilateral: Secondary | ICD-10-CM

## 2013-09-29 NOTE — Progress Notes (Signed)
CC:  Ear Problem      Warren Dixon is a 33 year old year old male seen today for follow up of  of chronic bilateral middle ear disease and eustachian tube dysfunction. Recalled that he underwent a right tympanomastoidectomy with cartilage graft reconstruction in May of 2012 for incipient cholesteatoma. He still has a right T tube. He was most recently seen by me in March 2015.  Had cold in January with recurrent effusion that has failed to resolve despite aggressive allergy meds. Unable to autoinsufflate left ear.  Does feel air going out right ear tube.  No change in hearing since we last saw him. Quite frustrating. No otorrhea.      Pertinent Review of systems:  Pt denies: shortness of breath, palpitations, sweating, weakness, headache, visual changes, cough, fever, chills, weight loss, dysphagia, odynophagia, facial weakness, numbness, or voice changes.      Outpatient Prescriptions Marked as Taking for the 09/29/13 encounter (Office Visit) with Dawayne Patricia   Medication Sig Dispense Refill   . Cetirizine HCl (ZYRTEC OR)        . Fluticasone Furoate (VERAMYST) 27.5 MCG/SPRAY Nasal Suspension Spray 2 sprays into each nostril daily.  1 Inhaler  5       Allergies  Review of patient's allergies indicates no known allergies.      Exam:  General appearance: healthy, alert, no distress, gait normal, voice slightly hoarse, alert and oriented    Eyes: Lids/periorbital skin normal, Conjunctivae/corneas clear, PERRL, EOM's intact, no nystagmus    Microscopic ear exam:   Right external ear, ear canal normal, TM - with intact T tube, patent with dry debris surrounding. Unable to remove without dislodging tube. No infection or retractions    Left external ear, ear canal, TM - slightly retracted with yellow serous effusion.     Dix Hallpike normal    Nose/sinus exam: Nares normal. Septum midline. Mucosa normal. No drainage or sinus tenderness.  Oropharynx: normal, floor of mouth soft with no masses.     Neuro: cranial  nerves 2-12 intact  Skin: Color, texture, turgor normal. 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 No response to aggressive medical treatment.  As discussed in past, because of impact on hearing , he is interested in replacing tube in the left ear.  Right ear exam is stable with no sign of recurrent cholesteatoma and a stable tube.          Procedure Note:  Myringotomy and tube left ear for persistent serous effusion and conductive hearing loss.    Audiogram, tympanometry and indications for tympanostomy tube reviewed with patient.    Risks including: loss of hearing, extrusion of tube, persistent tube, tympanic membrane perforation, facial nerve injury, taste disturbance discussed with patient in detail. Written consent obtained.    Ears examined with otomicroscope. left Tympanic membrane topically anesthetized with phenol solution.    Myringotomy placed in anterior inferior tympanic membrane. Tenacious mucoid fluid aspirated from middle ear.Trimmed T tube placed without difficulty. Tube verified to be in good position and patent.    Patient tolerated well.    Discussed dry ear precautions.      f/u 6  months for tube check and audio.    I, Clifford R. Hume,  spent fifteen minutes of a twenty minute visit personally counseling Mr. Ridolfi  related to the issues outlined above in addition to the time required for the procedure.

## 2013-09-29 NOTE — Progress Notes (Signed)
REASON FOR VISIT   Hearing evaluation.    SUBJECTIVE  This is an abbreviated note. Please see Otology note for complete case history. Mr. Shirleen SchirmerSean P Nusz, a 33 year old male, was referred by Dr. Leonides Cavelifford Hume, MD PhD of Otology for an audiogram.     Mr. Margo AyeHall reports:  Hearing loss: Yes    Tinnitus: Yes  Dizziness/Vertigo/Imbalance: No  Otorrhea: No   Otalgia: Intermittent Right Ear  Aural fullness: Constant Left Ear  History of middle ear pathology/surgery: Yes  Hearing aid/cochlear implant use: No       OBJECTIVE/ASSESSMENT  Otoscopic Inspection:   Right Ear: Clear EAC, visualized TM  Left Ear: Clear EAC, visualized TM    Tympanometry:  Right Ear: Large ECV, consistent with patent tube  Left Ear: Flat tracing consistent with middle ear dysfunction     Audiometry: See Epic under media tab for details  Right Ear: Hearing is stable when compared to prior audiogram  Left Ear: Hearing is stable when compared to prior audiogram    Word Recognition:  Right Ear: 100% words correct at 85 dBHL, with contralateral masking.  Left Ear: 100% words correct at 85 dBHL, with contralateral masking.    PLAN  Patient to be seen by Dr. Leonides Cavelifford Hume, MD PhD in our otology clinic immediately following this evaluation.

## 2013-12-15 ENCOUNTER — Ambulatory Visit (HOSPITAL_BASED_OUTPATIENT_CLINIC_OR_DEPARTMENT_OTHER): Payer: PPO | Admitting: Otology & Neurotology

## 2013-12-29 ENCOUNTER — Ambulatory Visit (HOSPITAL_BASED_OUTPATIENT_CLINIC_OR_DEPARTMENT_OTHER): Payer: No Typology Code available for payment source | Admitting: Otology & Neurotology

## 2014-01-19 ENCOUNTER — Ambulatory Visit (HOSPITAL_BASED_OUTPATIENT_CLINIC_OR_DEPARTMENT_OTHER): Payer: No Typology Code available for payment source | Admitting: Otology & Neurotology

## 2014-03-30 ENCOUNTER — Ambulatory Visit (HOSPITAL_BASED_OUTPATIENT_CLINIC_OR_DEPARTMENT_OTHER): Payer: No Typology Code available for payment source | Admitting: Otology & Neurotology

## 2014-06-23 HISTORY — PX: OTHER SURGICAL HISTORY: SHX169

## 2014-10-09 DIAGNOSIS — K219 Gastro-esophageal reflux disease without esophagitis: Secondary | ICD-10-CM | POA: Insufficient documentation

## 2015-05-14 ENCOUNTER — Emergency Department (HOSPITAL_COMMUNITY): Payer: Self-pay

## 2015-05-14 ENCOUNTER — Emergency Department (HOSPITAL_COMMUNITY)
Admission: EM | Admit: 2015-05-14 | Discharge: 2015-05-14 | Disposition: A | Discharge status: Home / Self Care | Payer: Self-pay | Attending: Emergency Medicine | Admitting: Emergency Medicine

## 2015-05-14 ENCOUNTER — Encounter (HOSPITAL_COMMUNITY): Payer: Self-pay | Admitting: Emergency Medicine

## 2015-05-14 DIAGNOSIS — F10230 Alcohol dependence with withdrawal, uncomplicated: Secondary | ICD-10-CM | POA: Insufficient documentation

## 2015-05-14 DIAGNOSIS — Z79899 Other long term (current) drug therapy: Secondary | ICD-10-CM | POA: Insufficient documentation

## 2015-05-14 DIAGNOSIS — F17210 Nicotine dependence, cigarettes, uncomplicated: Secondary | ICD-10-CM | POA: Insufficient documentation

## 2015-05-14 DIAGNOSIS — R002 Palpitations: Secondary | ICD-10-CM | POA: Insufficient documentation

## 2015-05-14 LAB — CBC
HCT: 52.4 % — ABNORMAL HIGH (ref 39.0–52.0)
Hemoglobin: 18.3 g/dL — ABNORMAL HIGH (ref 13.0–17.0)
MCH: 31.8 pg (ref 26.0–34.0)
MCHC: 34.9 g/dL (ref 30.0–36.0)
MCV: 91.1 fL (ref 78.0–100.0)
Platelets: 201 10*3/uL (ref 150–400)
RBC: 5.75 MIL/uL (ref 4.22–5.81)
RDW: 14.9 % (ref 11.5–15.5)
WBC: 10.4 10*3/uL (ref 4.0–10.5)

## 2015-05-14 LAB — URINALYSIS, ROUTINE W REFLEX MICROSCOPIC
Bilirubin Urine: NEGATIVE
GLUCOSE, UA: NEGATIVE mg/dL
HGB URINE DIPSTICK: NEGATIVE
Ketones, ur: NEGATIVE mg/dL
Leukocytes, UA: NEGATIVE
Nitrite: NEGATIVE
PH: 6.5 (ref 5.0–8.0)
PROTEIN: NEGATIVE mg/dL
SPECIFIC GRAVITY, URINE: 1.009 (ref 1.005–1.030)

## 2015-05-14 LAB — COMPREHENSIVE METABOLIC PANEL
ALT: 59 U/L (ref 17–63)
AST: 53 U/L — AB (ref 15–41)
Albumin: 4.6 g/dL (ref 3.5–5.0)
Alkaline Phosphatase: 103 U/L (ref 38–126)
Anion gap: 14 (ref 5–15)
BUN: <5 mg/dL — ABNORMAL LOW (ref 6–20)
CHLORIDE: 100 mmol/L — AB (ref 101–111)
CO2: 20 mmol/L — AB (ref 22–32)
Calcium: 9.8 mg/dL (ref 8.9–10.3)
Creatinine, Ser: 0.83 mg/dL (ref 0.61–1.24)
GFR calc non Af Amer: >60 mL/min (ref >60)
Glucose, Bld: 104 mg/dL — ABNORMAL HIGH (ref 65–99)
Potassium: 4.4 mmol/L (ref 3.5–5.1)
SODIUM: 134 mmol/L — AB (ref 135–145)
Total Bilirubin: 0.9 mg/dL (ref 0.3–1.2)
Total Protein: 7.6 g/dL (ref 6.5–8.1)

## 2015-05-14 LAB — I-STAT CG4 LACTIC ACID, ED
LACTIC ACID, VENOUS: 2.84 mmol/L — AB (ref 0.5–2.0)
LACTIC ACID, VENOUS: 1.92 mmol/L (ref 0.5–2.0)

## 2015-05-14 LAB — TYPE AND SCREEN
ABO/RH(D): O NEG
Antibody Screen: NEGATIVE

## 2015-05-14 LAB — ETHANOL: Alcohol, Ethyl (B): <5 mg/dL (ref <5)

## 2015-05-14 LAB — LIPASE, BLOOD: LIPASE: 25 U/L (ref 11–51)

## 2015-05-14 LAB — ABO/RH: ABO/RH(D): O NEG

## 2015-05-14 MED ORDER — GI COCKTAIL ~~LOC~~
30.0000 mL | Freq: Once | ORAL | Status: AC
  Administered 2015-05-14: 30 mL via ORAL
  Filled 2015-05-14: qty 30

## 2015-05-14 MED ORDER — LORAZEPAM 2 MG/ML IJ SOLN
1.0000 mg | Freq: Once | INTRAMUSCULAR | Status: AC
  Administered 2015-05-14: 1 mg via INTRAVENOUS
  Filled 2015-05-14: qty 1

## 2015-05-14 MED ORDER — LORAZEPAM 1 MG PO TABS: 0.0000 mg | ORAL_TABLET | Freq: Four times a day (QID) | ORAL | Status: DC

## 2015-05-14 MED ORDER — LORAZEPAM 1 MG PO TABS: 1.0000 mg | ORAL_TABLET | Freq: Two times a day (BID) | ORAL | Status: DC | PRN

## 2015-05-14 MED ORDER — LORAZEPAM 2 MG/ML IJ SOLN
0.0000 mg | Freq: Two times a day (BID) | INTRAMUSCULAR | Status: DC
  Filled 2015-05-14: qty 1

## 2015-05-14 MED ORDER — CHLORDIAZEPOXIDE HCL 25 MG PO CAPS
25.0000 mg | ORAL_CAPSULE | Freq: Once | ORAL | Status: AC
  Administered 2015-05-14: 25 mg via ORAL
  Filled 2015-05-14: qty 1

## 2015-05-14 MED ORDER — VITAMIN B-1 100 MG PO TABS: 100.0000 mg | ORAL_TABLET | Freq: Every day | ORAL | Status: DC

## 2015-05-14 MED ORDER — CHLORDIAZEPOXIDE HCL 25 MG PO CAPS: ORAL_CAPSULE | ORAL | Status: DC

## 2015-05-14 MED ORDER — IOHEXOL 300 MG/ML  SOLN
100.0000 mL | Freq: Once | INTRAMUSCULAR | Status: AC | PRN
  Administered 2015-05-14: 100 mL via INTRAVENOUS

## 2015-05-14 MED ORDER — SODIUM CHLORIDE 0.9 % IV BOLUS (SEPSIS)
1000.0000 mL | Freq: Once | INTRAVENOUS | Status: AC
  Administered 2015-05-14: 1000 mL via INTRAVENOUS

## 2015-05-14 MED ORDER — ONDANSETRON HCL 4 MG/2ML IJ SOLN
4.0000 mg | Freq: Once | INTRAMUSCULAR | Status: AC
  Administered 2015-05-14: 4 mg via INTRAVENOUS
  Filled 2015-05-14: qty 2

## 2015-05-14 MED ORDER — BARIUM SULFATE 2.1 % PO SUSP
ORAL | Status: AC
  Filled 2015-05-14: qty 1

## 2015-05-14 MED ORDER — LORAZEPAM 1 MG PO TABS: 0.0000 mg | ORAL_TABLET | Freq: Two times a day (BID) | ORAL | Status: DC

## 2015-05-14 MED ORDER — LORAZEPAM 2 MG/ML IJ SOLN
0.0000 mg | Freq: Four times a day (QID) | INTRAMUSCULAR | Status: DC
  Administered 2015-05-14: 2 mg via INTRAVENOUS
  Filled 2015-05-14: qty 1

## 2015-05-14 MED ORDER — MORPHINE SULFATE (PF) 4 MG/ML IV SOLN
4.0000 mg | Freq: Once | INTRAVENOUS | Status: AC
  Administered 2015-05-14: 4 mg via INTRAVENOUS
  Filled 2015-05-14: qty 1

## 2015-05-14 MED ORDER — THIAMINE HCL 100 MG/ML IJ SOLN: 100.0000 mg | Freq: Every day | INTRAMUSCULAR | Status: DC

## 2015-05-14 NOTE — ED Notes (Signed)
Pt to CT

## 2015-05-14 NOTE — ED Notes (Signed)
Pt sts he has been drinking less than usual (hx of daily drinking) and is anxious, nauseous and tachy, also c/o abd pain, A/O X4, NAD

## 2015-05-14 NOTE — ED Notes (Signed)
MD at bedside. 

## 2015-05-14 NOTE — ED Notes (Signed)
Patient verbalized understanding of discharge instructions and prescription medications and denies any further needs or questions at this time. VS stable, patient ambulatory.

## 2015-05-14 NOTE — ED Notes (Signed)
Per Lavone Neri in [place] pt still on list for scan

## 2015-05-14 NOTE — ED Provider Notes (Signed)
CSN: [id number]     Arrival date & time 05/14/15  1108 History   First MD Initiated Contact with Patient 05/14/15 1218     Chief Complaint  Patient presents with  . Withdrawal  . Abdominal Pain  . Rectal Bleeding     (Consider location/radiation/quality/duration/timing/severity/associated sxs/prior Treatment) Patient is a 34 y.o. male presenting with general illness. The history is provided by the patient.  Illness Severity:  Moderate Onset quality:  Sudden Duration:  1 day Timing:  Constant Progression:  Unchanged Chronicity:  New Associated symptoms: no abdominal pain, no chest pain, no congestion, no diarrhea, no fever, no headaches, no myalgias, no rash, no shortness of breath and no vomiting     34 yo M with a cc of tremors.  Patient stopped drinking this morning, having some significant anxiety, epigastric abdominal pain.  Hx of pancreatitis, concerned that this is reoccuring.  Denies fevers, chills.  Has had vomiting, difficulty tolerating PO.    Past Medical History  Diagnosis Date  . Atrial fibrillation ([place])    History reviewed. No pertinent past surgical history. No family history on file. Social History  Substance Use Topics  . Smoking status: Current Every Day Smoker  . Smokeless tobacco: None  . Alcohol Use: Yes    Review of Systems  Constitutional: Negative for fever and chills.  HENT: Negative for congestion and facial swelling.   Eyes: Negative for discharge and visual disturbance.  Respiratory: Negative for shortness of breath.   Cardiovascular: Positive for palpitations. Negative for chest pain.  Gastrointestinal: Negative for vomiting, abdominal pain and diarrhea.  Musculoskeletal: Negative for myalgias and arthralgias.  Skin: Negative for color change and rash.  Neurological: Positive for tremors. Negative for syncope and headaches.  Psychiatric/Behavioral: Negative for confusion and dysphoric mood.      Allergies  Review of patient's  allergies indicates no known allergies.  Home Medications   Prior to Admission medications   Medication Sig Start Date End Date Taking? Authorizing Provider  citalopram (CELEXA) 20 MG tablet Take 20 mg by mouth.   Yes Historical Provider, MD  clonazePAM (KLONOPIN) 1 MG tablet Take 1 mg by mouth as needed for anxiety.   Yes Historical Provider, MD  diltiazem (CARDIZEM CD) 120 MG 24 hr capsule Take 120 mg by mouth.   Yes Historical Provider, MD  ondansetron (ZOFRAN ODT) 8 MG disintegrating tablet Take 1 tablet (8 mg total) by mouth every 8 (eight) hours as needed for nausea. Patient not taking: Reported on 05/14/2015 04/05/13   Linton Flemings, MD   BP 128/95 mmHg  Pulse 112  Temp(Src) 98 F (36.7 C) (Oral)  Resp 19  Ht 5\' 11"  (1.803 m)  Wt 204 lb 3.2 oz (92.625 kg)  BMI 28.49 kg/m2  SpO2 98% Physical Exam  Constitutional: He is oriented to person, place, and time. He appears well-developed and well-nourished.  HENT:  Head: Normocephalic and atraumatic.  Eyes: EOM are normal. Pupils are equal, round, and reactive to light.  Neck: Normal range of motion. Neck supple. No JVD present.  Cardiovascular: Normal rate and regular rhythm.  Exam reveals no gallop and no friction rub.   No murmur heard. Pulmonary/Chest: No respiratory distress. He has no wheezes. He has no rales.  Abdominal: He exhibits no distension. There is no rebound and no guarding.  Musculoskeletal: Normal range of motion.  Neurological: He is alert and oriented to person, place, and time.  Skin: No rash noted. No pallor.  Psychiatric: He has a normal  mood and affect. His behavior is normal.  Nursing note and vitals reviewed.   ED Course  Procedures (including critical care time) Labs Review Labs Reviewed  COMPREHENSIVE METABOLIC PANEL - Abnormal; Notable for the following:    Sodium 134 (*)    Chloride 100 (*)    CO2 20 (*)    Glucose, Bld 104 (*)    BUN <5 (*)    AST 53 (*)    All other components within normal  limits  CBC - Abnormal; Notable for the following:    Hemoglobin 18.3 (*)    HCT 52.4 (*)    All other components within normal limits  I-STAT CG4 LACTIC ACID, ED - Abnormal; Notable for the following:    Lactic Acid, Venous 2.84 (*)    All other components within normal limits  LIPASE, BLOOD  URINALYSIS, ROUTINE W REFLEX MICROSCOPIC (NOT AT [facility])  I-STAT CG4 LACTIC ACID, ED  POC OCCULT BLOOD, ED  TYPE AND SCREEN  ABO/RH    Imaging Review Dg Chest 2 View  05/14/2015  CLINICAL DATA:  Chest pain for 2 day EXAM: CHEST  2 VIEW COMPARISON:  09/15/2014 FINDINGS: Normal heart size. Clear lungs. No pneumothorax or pleural effusion. IMPRESSION: No active cardiopulmonary disease. Electronically Signed   By: Marybelle Killings M.D.   On: 05/14/2015 13:14   Ct Abdomen Pelvis W Contrast  05/14/2015  CLINICAL DATA:  Mid abdominal pain with nausea, vomiting and diarrhea for 3 days. Alcohol abuse. Initial encounter. EXAM: CT ABDOMEN AND PELVIS WITH CONTRAST TECHNIQUE: Multidetector CT imaging of the abdomen and pelvis was performed using the standard protocol following bolus administration of intravenous contrast. CONTRAST:  100 mL OMNIPAQUE IOHEXOL 300 MG/ML  SOLN COMPARISON:  CT abdomen and pelvis 04/25/2015. CT abdomen and pelvis 02/05/2015. FINDINGS: The lung bases are clear.  No pleural or pericardial effusion. The liver is diffusely low attenuating consistent with fatty infiltration. No focal liver lesion is identified. The gallbladder, adrenal glands, spleen and right kidney are unremarkable. Small low attenuating lesion in the left kidney is compatible with a cyst. The pancreas enhances homogeneously. There is no peripancreatic fluid collection or inflammatory change. The splenic and portal veins are patent. Seminal vesicles, urinary bladder and prostate gland are unremarkable. The stomach, small and large bowel and appendix appear normal. No lymphadenopathy or fluid is identified. There is no focal bony  abnormality. IMPRESSION: Negative for pancreatitis.  No acute abnormality. Diffuse fatty infiltration of the liver. Electronically Signed   By: Inge Rise M.D.   On: 05/14/2015 18:50   I have personally reviewed and evaluated these images and lab results as part of my medical decision-making.   EKG Interpretation   Date/Time:  Monday May 14 2015 11:51:37 EST Ventricular Rate:  123 PR Interval:  146 QRS Duration: 88 QT Interval:  310 QTC Calculation: 443 R Axis:   99 Text Interpretation:  Sinus tachycardia Possible Left atrial enlargement  Rightward axis Borderline ECG Confirmed by MESNER MD, Corene Cornea [phone]) on  05/14/2015 4:05:59 PM      MDM   Final diagnoses:  Alcohol dependence with uncomplicated withdrawal ([place])    34 yo M with a cc of epigastric pain, vomiting and alcohol withdrawal.  Patient tachycardic, hypertensive and tremulous.  Given fluids and ativan with no change in vitals. With elevated lactic acid, ct scan to eval for complications of pancreatitis.  If negative and symptoms controlled, likely home with outpatient withdrawal follow up.   Medications given during this visit  Medications  Barium Sulfate 2.1 % SUSP (not administered)  LORazepam (ATIVAN) tablet 0-4 mg (not administered)  LORazepam (ATIVAN) tablet 0-4 mg (not administered)  LORazepam (ATIVAN) injection 0-4 mg (2 mg Intravenous Given 05/14/15 1900)  LORazepam (ATIVAN) injection 0-4 mg (not administered)  thiamine (VITAMIN B-1) tablet 100 mg (not administered)  thiamine (B-1) injection 100 mg (not administered)  sodium chloride 0.9 % bolus 1,000 mL (0 mLs Intravenous Stopped 05/14/15 1447)  gi cocktail (Maalox,Lidocaine,Donnatal) (30 mLs Oral Given 05/14/15 1251)  LORazepam (ATIVAN) injection 1 mg (1 mg Intravenous Given 05/14/15 1255)  ondansetron (ZOFRAN) injection 4 mg (4 mg Intravenous Given 05/14/15 1251)  LORazepam (ATIVAN) injection 1 mg (1 mg Intravenous Given 05/14/15 1254)  sodium  chloride 0.9 % bolus 1,000 mL (0 mLs Intravenous Stopped 05/14/15 1634)  LORazepam (ATIVAN) injection 1 mg (1 mg Intravenous Given 05/14/15 1458)  morphine 4 MG/ML injection 4 mg (4 mg Intravenous Given 05/14/15 1459)  iohexol (OMNIPAQUE) 300 MG/ML solution 100 mL (100 mLs Intravenous Contrast Given 05/14/15 1807)    New Prescriptions   No medications on file    The patient appears reasonably screen and/or stabilized for discharge and I doubt any other medical condition or other [facility] requiring further screening, evaluation, or treatment in the ED at this time prior to discharge.     Deno Etienne, DO 05/14/15 2010

## 2015-05-14 NOTE — ED Provider Notes (Signed)
1:20 PM Assumed care from Dr. Tyrone Nine, please see their note for full history, physical and decision making until this point. In brief this is a 34 y.o. year old male who presented to the ED tonight with Withdrawal; Abdominal Pain; and Rectal Bleeding     Getting CT scan, if normal, HR and stuff improved can go home. If not, admit for ETOH w/d.   Ct ok. Labs ok. HR around 105. BP normal. Slightly agitated. No e/o severe withdrawal. Discussed inpatient v outpatient v home detox. Patient wanted to get set up with an inpatient detox facility around here, asked care management to see him and patient wanted to go in the morning and thus would need to go on his own. Librium taper rx given, stable for dc without e/o DT's at this time.   Discharge instructions, including strict return precautions for new or worsening symptoms, given. Patient and/or family verbalized understanding and agreement with the plan as described.   Labs, studies and imaging reviewed by myself and considered in medical decision making if ordered. Imaging interpreted by radiology.  Labs Reviewed  COMPREHENSIVE METABOLIC PANEL - Abnormal; Notable for the following:    Sodium 134 (*)    Chloride 100 (*)    CO2 20 (*)    Glucose, Bld 104 (*)    BUN <5 (*)    AST 53 (*)    All other components within normal limits  CBC - Abnormal; Notable for the following:    Hemoglobin 18.3 (*)    HCT 52.4 (*)    All other components within normal limits  I-STAT CG4 LACTIC ACID, ED - Abnormal; Notable for the following:    Lactic Acid, Venous 2.84 (*)    All other components within normal limits  LIPASE, BLOOD  URINALYSIS, ROUTINE W REFLEX MICROSCOPIC (NOT AT [facility])  ETHANOL  I-STAT CG4 LACTIC ACID, ED  TYPE AND SCREEN  ABO/RH    CT Abdomen Pelvis W Contrast  Final Result    DG Chest 2 View  Final Result      No Follow-up on file.   Merrily Pew, MD 05/15/15 1320

## 2015-05-15 NOTE — ED Notes (Signed)
This RN wasted 2mg  Ativan, withdrawn at 1850. Waste witnessed by Racheal Patches, RN at 2224.

## 2015-06-15 ENCOUNTER — Emergency Department (HOSPITAL_BASED_OUTPATIENT_CLINIC_OR_DEPARTMENT_OTHER)
Admission: EM | Admit: 2015-06-15 | Discharge: 2015-06-15 | Disposition: A | Discharge status: Home / Self Care | Payer: Self-pay | Attending: Physician Assistant | Admitting: Physician Assistant

## 2015-06-15 ENCOUNTER — Emergency Department (HOSPITAL_BASED_OUTPATIENT_CLINIC_OR_DEPARTMENT_OTHER): Payer: Self-pay

## 2015-06-15 ENCOUNTER — Encounter (HOSPITAL_BASED_OUTPATIENT_CLINIC_OR_DEPARTMENT_OTHER): Payer: Self-pay

## 2015-06-15 DIAGNOSIS — Z79899 Other long term (current) drug therapy: Secondary | ICD-10-CM | POA: Insufficient documentation

## 2015-06-15 DIAGNOSIS — F17200 Nicotine dependence, unspecified, uncomplicated: Secondary | ICD-10-CM | POA: Insufficient documentation

## 2015-06-15 DIAGNOSIS — J069 Acute upper respiratory infection, unspecified: Secondary | ICD-10-CM | POA: Insufficient documentation

## 2015-06-15 DIAGNOSIS — I4891 Unspecified atrial fibrillation: Secondary | ICD-10-CM | POA: Insufficient documentation

## 2015-06-15 DIAGNOSIS — Z8719 Personal history of other diseases of the digestive system: Secondary | ICD-10-CM | POA: Insufficient documentation

## 2015-06-15 HISTORY — DX: Cyst of pancreas: K86.2

## 2015-06-15 HISTORY — DX: Pseudocyst of pancreas: K86.3

## 2015-06-15 MED ORDER — ACETAMINOPHEN 325 MG PO TABS: 650.0000 mg | ORAL_TABLET | Freq: Four times a day (QID) | ORAL | Status: DC | PRN

## 2015-06-15 MED ORDER — ACETAMINOPHEN 325 MG PO TABS
650.0000 mg | ORAL_TABLET | Freq: Once | ORAL | Status: AC
  Administered 2015-06-15: 650 mg via ORAL
  Filled 2015-06-15: qty 2

## 2015-06-15 NOTE — ED Notes (Signed)
Reports from [facility] with head cold that is now chest cold.  Reports nosebleeds and coughing up green sputum.

## 2015-06-15 NOTE — ED Provider Notes (Signed)
CSN: [id number]     Arrival date & time 06/15/15  1048 History   First MD Initiated Contact with Patient 06/15/15 1136     Chief Complaint  Patient presents with  . Nasal Congestion     (Consider location/radiation/quality/duration/timing/severity/associated sxs/prior Treatment) HPI   Patient is a 34 year old male here from [facility]. Patient reports that he's had nasal congestion for the last couple days. In addition he's had a mild cough. He reports that he needs a doctor's note to be able to take Tylenol and wear a hat to keep warm in his facility. Patient has no nausea vomiting diarrhea. No fevers.  Patient reports eating and drinking normally.  Past Medical History  Diagnosis Date  . Atrial fibrillation ([place])   . Pancreatic pseudocyst/cyst    History reviewed. No pertinent past surgical history. No family history on file. Social History  Substance Use Topics  . Smoking status: Current Every Day Smoker  . Smokeless tobacco: None  . Alcohol Use: No     Comment: recovering    Review of Systems  Constitutional: Negative for activity change.  HENT: Positive for congestion and sinus pressure. Negative for ear pain.   Eyes: Negative for discharge.  Respiratory: Positive for cough. Negative for shortness of breath.   Cardiovascular: Negative for chest pain.  Gastrointestinal: Negative for abdominal pain.  Neurological: Negative for dizziness.  Psychiatric/Behavioral: Negative for agitation.      Allergies  Review of patient's allergies indicates no known allergies.  Home Medications   Prior to Admission medications   Medication Sig Start Date End Date Taking? Authorizing Provider  diltiazem (CARDIZEM CD) 120 MG 24 hr capsule Take 120 mg by mouth.   Yes Historical Provider, MD  gabapentin (NEURONTIN) 400 MG capsule Take 400 mg by mouth 3 (three) times daily.   Yes Historical Provider, MD  QUEtiapine (SEROQUEL) 100 MG tablet Take 100 mg by mouth at bedtime.   Yes  Historical Provider, MD  sertraline (ZOLOFT) 100 MG tablet Take 100 mg by mouth daily.   Yes Historical Provider, MD  chlordiazePOXIDE (LIBRIUM) 25 MG capsule 50mg  PO TID x 1D, then 25-50mg  PO BID X 1D, then 25-50mg  PO QD X 1D 05/14/15   Merrily Pew, MD  citalopram (CELEXA) 20 MG tablet Take 20 mg by mouth.    Historical Provider, MD  clonazePAM (KLONOPIN) 1 MG tablet Take 1 mg by mouth as needed for anxiety.    Historical Provider, MD  LORazepam (ATIVAN) 1 MG tablet Take 1 tablet (1 mg total) by mouth every 12 (twelve) hours as needed for anxiety. 05/14/15   Merrily Pew, MD  ondansetron (ZOFRAN ODT) 8 MG disintegrating tablet Take 1 tablet (8 mg total) by mouth every 8 (eight) hours as needed for nausea. Patient not taking: Reported on 05/14/2015 04/05/13   Linton Flemings, MD   BP 126/97 mmHg  Pulse 88  Temp(Src) 98.6 F (37 C) (Oral)  Resp 18  Ht 5\' 11"  (1.803 m)  Wt 216 lb (97.977 kg)  BMI 30.14 kg/m2  SpO2 100% Physical Exam  Constitutional: He is oriented to person, place, and time. He appears well-nourished.  HENT:  Head: Normocephalic.  Right Ear: External ear normal.  Left Ear: External ear normal.  Nose: Nose normal.  Mouth/Throat: Oropharynx is clear and moist. No oropharyngeal exudate.  Eyes: Conjunctivae are normal.  Neck: No tracheal deviation present.  Cardiovascular: Normal rate.   Pulmonary/Chest: Effort normal. No stridor. No respiratory distress.  Abdominal: Soft. There is no  tenderness. There is no guarding.  Musculoskeletal: Normal range of motion. He exhibits no edema.  Neurological: He is oriented to person, place, and time. No cranial nerve deficit.  Skin: Skin is warm and dry. No rash noted. He is not diaphoretic.  Psychiatric: He has a normal mood and affect. His behavior is normal.  Nursing note and vitals reviewed.   ED Course  Procedures (including critical care time) Labs Review Labs Reviewed - No data to display  Imaging Review No results  found. I have personally reviewed and evaluated these images and lab results as part of my medical decision-making.   EKG Interpretation None      MDM   Final diagnoses:  None   patient is a 34 year old male in the [facility] program presenting with upper respiratory infection symptoms for the last 3-4 days. Patient's physical exam 100% normal. No wheezing. No evidence of erythema to  nasal turbinates or posterior erythema. Will get chest x-ray. However expect this to be negative. We'll treat with Tylenol and nasal saline. We'll have him follow-up with his primary care physician as needed. Patient's vital signs stable and otherwise appears in no acute distress.   Simmie Garin Julio Alm, MD 06/15/15 1209

## 2015-06-15 NOTE — Discharge Instructions (Signed)
Patient may take tylenol 650 mg every 6 hours as needed for 3 days. He can use nasal saline and wear a hat if permitted by your staff.

## 2015-07-01 ENCOUNTER — Emergency Department (HOSPITAL_BASED_OUTPATIENT_CLINIC_OR_DEPARTMENT_OTHER)
Admission: EM | Admit: 2015-07-01 | Discharge: 2015-07-01 | Disposition: A | Discharge status: Home / Self Care | Payer: Self-pay | Attending: Physician Assistant | Admitting: Physician Assistant

## 2015-07-01 ENCOUNTER — Encounter (HOSPITAL_BASED_OUTPATIENT_CLINIC_OR_DEPARTMENT_OTHER): Payer: Self-pay

## 2015-07-01 DIAGNOSIS — I4891 Unspecified atrial fibrillation: Secondary | ICD-10-CM | POA: Insufficient documentation

## 2015-07-01 DIAGNOSIS — K029 Dental caries, unspecified: Secondary | ICD-10-CM | POA: Insufficient documentation

## 2015-07-01 DIAGNOSIS — F17200 Nicotine dependence, unspecified, uncomplicated: Secondary | ICD-10-CM | POA: Insufficient documentation

## 2015-07-01 DIAGNOSIS — K0381 Cracked tooth: Secondary | ICD-10-CM | POA: Insufficient documentation

## 2015-07-01 DIAGNOSIS — Z79899 Other long term (current) drug therapy: Secondary | ICD-10-CM | POA: Insufficient documentation

## 2015-07-01 MED ORDER — BUPIVACAINE-EPINEPHRINE (PF) 0.5% -1:200000 IJ SOLN
1.8000 mL | Freq: Once | INTRAMUSCULAR | Status: AC
  Administered 2015-07-01: 1.8 mL
  Filled 2015-07-01: qty 1.8

## 2015-07-01 MED ORDER — AMOXICILLIN 500 MG PO CAPS: 500.0000 mg | ORAL_CAPSULE | Freq: Three times a day (TID) | ORAL | Status: DC

## 2015-07-01 MED ORDER — AMOXICILLIN 500 MG PO CAPS
500.0000 mg | ORAL_CAPSULE | Freq: Once | ORAL | Status: AC
  Administered 2015-07-01: 500 mg via ORAL
  Filled 2015-07-01: qty 1

## 2015-07-01 MED ORDER — HYDROCODONE-ACETAMINOPHEN 5-325 MG PO TABS: ORAL_TABLET | ORAL | Status: DC

## 2015-07-01 NOTE — ED Provider Notes (Signed)
CSN: [id number]     Arrival date & time 07/01/15  1245 History   First MD Initiated Contact with Patient 07/01/15 1302     Chief Complaint  Patient presents with  . Dental Pain     (Consider location/radiation/quality/duration/timing/severity/associated sxs/prior Treatment) HPI  Blood pressure 148/96, pulse 120, temperature 98.4 F (36.9 C), temperature source Oral, resp. rate 19, height 5\' 11"  (1.803 m), weight 95.255 kg, SpO2 99 %.  Alejandro Flores is a 35 y.o. male complaining of left lower dental pain worsening over the course of 2 days. Patient has extensive dental caries and tooth broke well eating. Rates his pain at 8 out of 10, he's been taking naproxen at home with little relief. He has an appointment with dentist at the end of January. Denies fever/chills, difficulty opening jaw, difficulty swallowing, SOB, gum swelling, facial swelling, neck swelling.  Patient has history of A. fib, takes Cardizem 120 daily, had a states dose this a.m. States that he knows when he is A. fib he feels irregular pulse. States that he doesn't feel like he is in A. fib now.   Past Medical History  Diagnosis Date  . Atrial fibrillation ([place])   . Pancreatic pseudocyst/cyst    History reviewed. No pertinent past surgical history. No family history on file. Social History  Substance Use Topics  . Smoking status: Current Every Day Smoker  . Smokeless tobacco: None  . Alcohol Use: No     Comment: recovering    Review of Systems  10 systems reviewed and found to be negative, except as noted in the HPI.   Allergies  Review of patient's allergies indicates no known allergies.  Home Medications   Prior to Admission medications   Medication Sig Start Date End Date Taking? Authorizing Provider  acetaminophen (TYLENOL) 325 MG tablet Take 2 tablets (650 mg total) by mouth every 6 (six) hours as needed. 06/15/15   Courteney Lyn Mackuen, MD  amoxicillin (AMOXIL) 500 MG capsule Take 1 capsule (500 mg  total) by mouth 3 (three) times daily. 07/01/15   Ralyn Stlaurent, PA-C  chlordiazePOXIDE (LIBRIUM) 25 MG capsule 50mg  PO TID x 1D, then 25-50mg  PO BID X 1D, then 25-50mg  PO QD X 1D 05/14/15   Merrily Pew, MD  citalopram (CELEXA) 20 MG tablet Take 20 mg by mouth.    Historical Provider, MD  clonazePAM (KLONOPIN) 1 MG tablet Take 1 mg by mouth as needed for anxiety.    Historical Provider, MD  diltiazem (CARDIZEM CD) 120 MG 24 hr capsule Take 120 mg by mouth.    Historical Provider, MD  gabapentin (NEURONTIN) 400 MG capsule Take 400 mg by mouth 3 (three) times daily.    Historical Provider, MD  HYDROcodone-acetaminophen (NORCO/VICODIN) 5-325 MG tablet Take 1-2 tablets by mouth every 6 hours as needed for pain and/or cough. 07/01/15   Elva Breaker, PA-C  LORazepam (ATIVAN) 1 MG tablet Take 1 tablet (1 mg total) by mouth every 12 (twelve) hours as needed for anxiety. 05/14/15   Merrily Pew, MD  ondansetron (ZOFRAN ODT) 8 MG disintegrating tablet Take 1 tablet (8 mg total) by mouth every 8 (eight) hours as needed for nausea. Patient not taking: Reported on 05/14/2015 04/05/13   Linton Flemings, MD  QUEtiapine (SEROQUEL) 100 MG tablet Take 100 mg by mouth at bedtime.    Historical Provider, MD  sertraline (ZOLOFT) 100 MG tablet Take 100 mg by mouth daily.    Historical Provider, MD   BP 135/92 mmHg  Pulse  104  Temp(Src) 98.4 F (36.9 C) (Oral)  Resp 20  Ht 5\' 11"  (1.803 m)  Wt 95.255 kg  BMI 29.30 kg/m2  SpO2 100% Physical Exam  Constitutional: He is oriented to person, place, and time. He appears well-developed and well-nourished. No distress.  HENT:  Head: Normocephalic.  Mouth/Throat: Oropharynx is clear and moist.  Generally poor dentition, multiple deep dental caries eroding into the gum, no gingival swelling, erythema or tenderness to palpation. Patient is handling their secretions. There is no tenderness to palpation or firmness underneath tongue bilaterally. No trismus.    Eyes:  Conjunctivae and EOM are normal. Pupils are equal, round, and reactive to light.  Neck: Normal range of motion.  Cardiovascular: Regular rhythm and intact distal pulses.   Tachycardic, regular rhythm  Pulmonary/Chest: Effort normal. No stridor.  Abdominal: Soft.  Musculoskeletal: Normal range of motion.  Lymphadenopathy:    He has no cervical adenopathy.  Neurological: He is alert and oriented to person, place, and time.  Psychiatric: He has a normal mood and affect.  Nursing note and vitals reviewed.   ED Course  .Nerve Block Date/Time: 07/01/2015 1:33 PM Performed by: Monico Blitz Authorized by: Monico Blitz Consent: Verbal consent obtained. Consent given by: patient Patient identity confirmed: verbally with patient Indications: pain relief Body area: face/mouth Nerve: inferior alveolar Laterality: left Patient sedated: no Patient position: sitting Needle gauge: 61 G Location technique: anatomical landmarks Local anesthetic: bupivacaine 0.5% with epinephrine Anesthetic total: 1.8 ml Outcome: pain improved Patient tolerance: Patient tolerated the procedure well with no immediate complications   (including critical care time) Labs Review Labs Reviewed - No data to display  Imaging Review No results found. I have personally reviewed and evaluated these images and lab results as part of my medical decision-making.   EKG Interpretation None      MDM   Final diagnoses:  Pain due to dental caries    Filed Vitals:   07/01/15 1303 07/01/15 1333  BP: 148/96 135/92  Pulse: 120 104  Temp: 98.4 F (36.9 C)   TempSrc: Oral   Resp: 19 20  Height: 5\' 11"  (1.803 m)   Weight: 95.255 kg   SpO2: 99% 100%    Medications  bupivacaine-epinephrine (MARCAINE W/ EPI) 0.5% -1:200000 injection 1.8 mL (1.8 mLs Infiltration Given 07/01/15 1319)  amoxicillin (AMOXIL) capsule 500 mg (500 mg Oral Given 07/01/15 1319)    Alejandro Flores is 35 y.o. male presenting with dental  pain associated with dental caries but no signs or symptoms of dental abscess. Patient afebrile, non toxic appearing and swallowing secretions well. I gave patient referral to dentist and stressed the importance of dental follow up for definitive management of dental issues. Patient voices understanding and is agreeable to plan.  With history of A. fib, regular rhythm and mildly tachycardic. Patient has no symptoms and does not believe he is in A. fib, we'll recheck pulse after patient has a chance to sit pain to relief again.  Evaluation does not show pathology that would require ongoing emergent intervention or inpatient treatment. Pt is hemodynamically stable and mentating appropriately. Discussed findings and plan with patient/guardian, who agrees with care plan. All questions answered. Return precautions discussed and outpatient follow up given.   New Prescriptions   AMOXICILLIN (AMOXIL) 500 MG CAPSULE    Take 1 capsule (500 mg total) by mouth 3 (three) times daily.   HYDROCODONE-ACETAMINOPHEN (NORCO/VICODIN) 5-325 MG TABLET    Take 1-2 tablets by mouth every 6 hours as needed  for pain and/or cough.         Monico Blitz, PA-C 07/01/15 [place], MD 07/05/15 0025

## 2015-07-01 NOTE — Discharge Instructions (Signed)
Take vicodin for breakthrough pain, do not drink alcohol, drive, care for children or do other critical tasks while taking vicodin.  Return to the emergency room for fever, change in vision, redness to the face that rapidly spreads towards the eye, nausea or vomiting, difficulty swallowing or shortness of breath.   Apply warm compresses to jaw throughout the day.   Take your antibiotics as directed and to the end of the course.   Followup with a dentist is very important for ongoing evaluation and management of recurrent dental pain. Return to emergency department for emergent changing or worsening symptoms."  Low-cost dental clinic: **[facility]  [facility]  at [phone]**   You may also call [phone]  Dental Assistance If the dentist on-call cannot see you, please use the resources below:   Patients with Medicaid: [facility] [address], [phone], [phone]  If unable to pay, or uninsured, contact [facility] ([phone]) or [facility] ([phone] in , [phone] in [place]) to become qualified for the adult dental clinic  Other Low-Cost Community Dental Services: [facility]- [address], [place], [place], [address]    [phone], Ext. [phone]    2nd and 4th Thursday of the month at 6:30am    10 clients each day by appointment, can sometimes see walk-in patients if someone does not show for an appointment [facility]- [address], [place], [place], [address]    [phone] [facility]- [address], [place], [place], [address]    [phone]  [facility]- [phone] [facility]- [phone] [facility]- [phone]  

## 2015-07-01 NOTE — ED Notes (Signed)
Patient here with left lower dental pain since Friday evening, reports broke tooth while eating

## 2015-07-13 ENCOUNTER — Encounter (HOSPITAL_BASED_OUTPATIENT_CLINIC_OR_DEPARTMENT_OTHER): Payer: Self-pay | Admitting: Emergency Medicine

## 2015-07-13 ENCOUNTER — Emergency Department (HOSPITAL_BASED_OUTPATIENT_CLINIC_OR_DEPARTMENT_OTHER)
Admission: EM | Admit: 2015-07-13 | Discharge: 2015-07-13 | Disposition: A | Discharge status: Home / Self Care | Payer: Self-pay | Attending: Emergency Medicine | Admitting: Emergency Medicine

## 2015-07-13 DIAGNOSIS — I4891 Unspecified atrial fibrillation: Secondary | ICD-10-CM | POA: Insufficient documentation

## 2015-07-13 DIAGNOSIS — Z792 Long term (current) use of antibiotics: Secondary | ICD-10-CM | POA: Insufficient documentation

## 2015-07-13 DIAGNOSIS — F17200 Nicotine dependence, unspecified, uncomplicated: Secondary | ICD-10-CM | POA: Insufficient documentation

## 2015-07-13 DIAGNOSIS — K029 Dental caries, unspecified: Secondary | ICD-10-CM | POA: Insufficient documentation

## 2015-07-13 DIAGNOSIS — F419 Anxiety disorder, unspecified: Secondary | ICD-10-CM | POA: Insufficient documentation

## 2015-07-13 DIAGNOSIS — Z79899 Other long term (current) drug therapy: Secondary | ICD-10-CM | POA: Insufficient documentation

## 2015-07-13 HISTORY — DX: Alcohol abuse, uncomplicated: F10.10

## 2015-07-13 HISTORY — DX: Anxiety disorder, unspecified: F41.9

## 2015-07-13 MED ORDER — IBUPROFEN 800 MG PO TABS: 800.0000 mg | ORAL_TABLET | Freq: Three times a day (TID) | ORAL | Status: DC | PRN

## 2015-07-13 MED ORDER — AMOXICILLIN 500 MG PO CAPS: 500.0000 mg | ORAL_CAPSULE | Freq: Three times a day (TID) | ORAL | Status: DC

## 2015-07-13 NOTE — Discharge Instructions (Signed)

## 2015-07-13 NOTE — ED Notes (Signed)
Pt reports he broke left lower molar tooth off 3 weeks ago, no problems until this am, went to dentist, but he has no insurance and they wanted 600$ to be seen

## 2015-07-13 NOTE — ED Provider Notes (Signed)
CSN: [id number]     Arrival date & time 07/13/15  1038 History   First MD Initiated Contact with Patient 07/13/15 1318     Chief Complaint  Patient presents with  . Dental Pain     (Consider location/radiation/quality/duration/timing/severity/associated sxs/prior Treatment) HPI    35 year old male with history of alcohol abuse, anxiety,  atrial fibrillation not on any blood thinner medication presenting with complaints of dental pain. Patient reportedly broke his left lower molar tooth 3 weeks ago. He was seen on 1/8 for this complaint and was giving antibiotic and pain medication. He was referred to a dentist and went to the dentist today.  He was told he will need an oral surgeon to have it surgically removed and report he may need additional treatment of infection before it can be done.  He also report with lack of insurance he will need to pay $600 but unable to afford it at this time.   Pain is waxing waning, worsening with cold air with chewing. He has been avoiding sugary foods and also eating temperature neutral food to help with his pain. He denies having any fever, neck pain, trouble swallowing oral any other symptoms.  Past Medical History  Diagnosis Date  . Atrial fibrillation ([place])   . Pancreatic pseudocyst/cyst   . ETOH abuse   . Anxiety    History reviewed. No pertinent past surgical history. History reviewed. No pertinent family history. Social History  Substance Use Topics  . Smoking status: Current Every Day Smoker  . Smokeless tobacco: None  . Alcohol Use: No     Comment: recovering    Review of Systems  Constitutional: Negative for fever.  HENT: Positive for dental problem.       Allergies  Review of patient's allergies indicates no known allergies.  Home Medications   Prior to Admission medications   Medication Sig Start Date End Date Taking? Authorizing Provider  diltiazem (CARDIZEM CD) 120 MG 24 hr capsule Take 120 mg by mouth.   Yes Historical  Provider, MD  QUEtiapine (SEROQUEL) 100 MG tablet Take 100 mg by mouth at bedtime.   Yes Historical Provider, MD  sertraline (ZOLOFT) 100 MG tablet Take 100 mg by mouth daily.   Yes Historical Provider, MD  acetaminophen (TYLENOL) 325 MG tablet Take 2 tablets (650 mg total) by mouth every 6 (six) hours as needed. 06/15/15   Courteney Lyn Mackuen, MD  amoxicillin (AMOXIL) 500 MG capsule Take 1 capsule (500 mg total) by mouth 3 (three) times daily. 07/01/15   Nicole Pisciotta, PA-C  chlordiazePOXIDE (LIBRIUM) 25 MG capsule 50mg  PO TID x 1D, then 25-50mg  PO BID X 1D, then 25-50mg  PO QD X 1D 05/14/15   Merrily Pew, MD  citalopram (CELEXA) 20 MG tablet Take 20 mg by mouth.    Historical Provider, MD  clonazePAM (KLONOPIN) 1 MG tablet Take 1 mg by mouth as needed for anxiety.    Historical Provider, MD  gabapentin (NEURONTIN) 400 MG capsule Take 400 mg by mouth 3 (three) times daily.    Historical Provider, MD  HYDROcodone-acetaminophen (NORCO/VICODIN) 5-325 MG tablet Take 1-2 tablets by mouth every 6 hours as needed for pain and/or cough. 07/01/15   Nicole Pisciotta, PA-C  LORazepam (ATIVAN) 1 MG tablet Take 1 tablet (1 mg total) by mouth every 12 (twelve) hours as needed for anxiety. 05/14/15   Merrily Pew, MD  ondansetron (ZOFRAN ODT) 8 MG disintegrating tablet Take 1 tablet (8 mg total) by mouth every 8 (eight) hours  as needed for nausea. Patient not taking: Reported on 05/14/2015 04/05/13   Linton Flemings, MD   BP 138/95 mmHg  Pulse 108  Temp(Src) 98.4 F (36.9 C) (Oral)  Resp 18  Ht 5\' 11"  (1.803 m)  Wt 99.791 kg  BMI 30.70 kg/m2  SpO2 99% Physical Exam  Constitutional: He appears well-developed and well-nourished. No distress.  HENT:  Head: Atraumatic.   Mouth: Significant  Evidence of dental decay noted throughout mouth, with tenderness to tooth number 19 and 20 with surrounding gingival erythema but no obvious abscess. No trismus. No evidence of deep tissue infection.  Eyes: Conjunctivae are  normal.  Neck: Neck supple.  Lymphadenopathy:    He has no cervical adenopathy.  Neurological: He is alert.  Skin: No rash noted.  Psychiatric: He has a normal mood and affect.  Nursing note and vitals reviewed.   ED Course  Procedures (including critical care time) Labs Review Labs Reviewed - No data to display  Imaging Review No results found. I have personally reviewed and evaluated these images and lab results as part of my medical decision-making.   EKG Interpretation None      MDM   Final diagnoses:  Dental decay    BP 138/95 mmHg  Pulse 108  Temp(Src) 98.4 F (36.9 C) (Oral)  Resp 18  Ht 5\' 11"  (1.803 m)  Wt 99.791 kg  BMI 30.70 kg/m2  SpO2 99%   1:41 PM  patient with widespread dental decay continue with dental pain. Will prescribe NSAIDs and antibiotic. Encouraged patient to receive definitive care through an oral surgeon.  Domenic Moras, PA-C 07/13/15 1343  Julianne Rice, MD 07/15/15 [phone]

## 2015-08-02 ENCOUNTER — Encounter (HOSPITAL_COMMUNITY): Payer: Self-pay

## 2015-08-02 ENCOUNTER — Emergency Department (HOSPITAL_COMMUNITY)
Admission: EM | Admit: 2015-08-02 | Discharge: 2015-08-02 | Disposition: A | Discharge status: Home / Self Care | Payer: Self-pay | Attending: Emergency Medicine | Admitting: Emergency Medicine

## 2015-08-02 DIAGNOSIS — Z79899 Other long term (current) drug therapy: Secondary | ICD-10-CM | POA: Insufficient documentation

## 2015-08-02 DIAGNOSIS — K029 Dental caries, unspecified: Secondary | ICD-10-CM | POA: Insufficient documentation

## 2015-08-02 DIAGNOSIS — K0889 Other specified disorders of teeth and supporting structures: Secondary | ICD-10-CM | POA: Insufficient documentation

## 2015-08-02 DIAGNOSIS — F419 Anxiety disorder, unspecified: Secondary | ICD-10-CM | POA: Insufficient documentation

## 2015-08-02 DIAGNOSIS — F17200 Nicotine dependence, unspecified, uncomplicated: Secondary | ICD-10-CM | POA: Insufficient documentation

## 2015-08-02 MED ORDER — HYDROCODONE-ACETAMINOPHEN 5-325 MG PO TABS: ORAL_TABLET | ORAL | Status: DC

## 2015-08-02 MED ORDER — BUPIVACAINE-EPINEPHRINE (PF) 0.5% -1:200000 IJ SOLN
1.8000 mL | Freq: Once | INTRAMUSCULAR | Status: AC
  Administered 2015-08-02: 1.8 mL
  Filled 2015-08-02: qty 1.8

## 2015-08-02 MED ORDER — AMOXICILLIN 500 MG PO CAPS
500.0000 mg | ORAL_CAPSULE | Freq: Once | ORAL | Status: AC
  Administered 2015-08-02: 500 mg via ORAL
  Filled 2015-08-02: qty 1

## 2015-08-02 MED ORDER — AMOXICILLIN 500 MG PO CAPS: 1000.0000 mg | ORAL_CAPSULE | Freq: Two times a day (BID) | ORAL | Status: DC

## 2015-08-02 NOTE — ED Notes (Signed)
Patient here with left lower dental pain x a few days. Possible abscess to same

## 2015-08-02 NOTE — Discharge Instructions (Signed)
Take vicodin for breakthrough pain, do not drink alcohol, drive, care for children or do other critical tasks while taking vicodin.  Return to the emergency room for fever, change in vision, redness to the face that rapidly spreads towards the eye, nausea or vomiting, difficulty swallowing or shortness of breath.   Apply warm compresses to jaw throughout the day.   Take your antibiotics as directed and to the end of the course.   Followup with a dentist is very important for ongoing evaluation and management of recurrent dental pain. Return to emergency department for emergent changing or worsening symptoms."   [facility] [address] is a great source of information about community services available.  Access by dialing 2-1-1 from anywhere in [place], or by website -  [employer]fi.   Other Local Resources (Updated 06/2015)  Dental  Care   Services    Phone Number and Address  Cost  [facility] For children 55 - 20 years of age:   Cleaning  Tooth brushing/flossing instruction  Sealants, fillings, crowns  Extractions  Emergency treatment  [phone] [address], [place] [address] Charges based on family income.  Medicaid and some insurance plans accepted.     [facility] - [facility], fillings, crowns  Extractions  Emergency treatment [phone] [address], [place]  Pregnant women 56 years of age or older with a Medicaid card  [facility] - [place]  Cleaning  Sealants, fillings, crowns  Extractions  Emergency treatment [phone] [address] [place], [place] Pregnant women 59 years of age or older with a Medicaid card  [facility] For children 38 - 11 years of age:   Cleaning  Tooth brushing/flossing  instruction  Sealants, fillings, crowns  Extractions  Emergency treatment Limited orthodontic services for patients with Medicaid [phone] [address], [place] [address] Medicaid and [facility] Health Choice cover for children up to age 46 and pregnant women.  Parents of children up to age 81 without Medicaid pay a reduced fee at time of service.  [place] For children 54 - 69 years of age:   Cleaning  Tooth brushing/flossing instruction  Sealants, fillings, crowns  Extractions  Emergency treatment Limited orthodontic services for patients with Medicaid [phone] [place], [place].  Medicaid and [place] Health Choice cover for children up to age 75 and pregnant women.  Parents of children up to age 27 without Medicaid pay a reduced fee.  [facility]  Sealants, fillings, crowns  Extractions  Hours: Tuesdays and Thursdays, 4:15 - 8 pm [phone] [address], [place],  [address] Services free of charge to [facility] residents ages 18-64 who do not have health insurance, Medicare, [place], or [place] benefits and fall within federal poverty guidelines  [place] care in addition to primary medical care, nutritional counseling, and pharmacy:  Engineer, drilling, fillings, crowns  Extractions                  [phone] [facility], [place], [place], [place], [facility] [place], [place]  [facility], [place], [place] [facility] [place], [place], [place], most insurance.  Also provides services available to all  with fees adjusted based on ability to pay.    [facility]  Cleaning  Tooth brushing/flossing instruction  Sealants, fillings, crowns  Extractions  Emergency treatment Hours: Tuesdays, Thursdays, and Fridays from 8 am to 5 pm by appointment only. [phone] [address], [place] [address] [facility] residents with Medicaid (depending on eligibility) and children with [facility] Health Choice - call for more information.  [facility]  Extractions only  Hours: 2nd and 4th Thursday of each month from 6:30 am - 9 am.   [phone] ext. [address] [place], [place] [address] Ages 59 and older only.  Patients are seen on a first come, first served basis.  [employer]  [employer]  Extractions  Orthodontics  Endodontics  Implants/Crowns/Bridges  Complete and partial dentures [phone] [place], [place] Patients must complete an application for services.  There is often a waiting list.

## 2015-08-02 NOTE — ED Provider Notes (Signed)
CSN: [id number]     Arrival date & time 08/02/15  1106 History  By signing my name below, I, Essence Howell, attest that this documentation has been prepared under the direction and in the presence of Monico Blitz, PA-C Electronically Signed: Ladene Artist, ED Scribe 08/02/2015 at 12:09 PM.   Chief Complaint  Patient presents with  . Dental Pain   The history is provided by the patient. No language interpreter was used.   HPI Comments: Alejandro Flores is a 35 y.o. male who presents to the Emergency Department complaining of intermittent left lower dental pain onset 1 week ago, constant over the past 3 days. Pt states that he was eating pretzels last week when he chipped his tooth. He reports constant, 8/10 pain that is exacerbated with cold air, eating and pressure. He has tried Tylenol and eating room temperature foods without significant relief. Pt does not currently have a dentist. He has an orange card and an appointment on 08/06/15 for a referral to a PCP.   Past Medical History  Diagnosis Date  . Atrial fibrillation ([place])   . Pancreatic pseudocyst/cyst   . ETOH abuse   . Anxiety    History reviewed. No pertinent past surgical history. No family history on file. Social History  Substance Use Topics  . Smoking status: Current Every Day Smoker  . Smokeless tobacco: None  . Alcohol Use: No     Comment: recovering    Review of Systems A complete 10 system review of systems was obtained and all systems are negative except as noted in the HPI and PMH.   Allergies  Review of patient's allergies indicates no known allergies.  Home Medications   Prior to Admission medications   Medication Sig Start Date End Date Taking? Authorizing Provider  acetaminophen (TYLENOL) 325 MG tablet Take 2 tablets (650 mg total) by mouth every 6 (six) hours as needed. 06/15/15   Courteney Lyn Mackuen, MD  amoxicillin (AMOXIL) 500 MG capsule Take 1 capsule (500 mg total) by mouth 3 (three) times daily.  07/13/15   Domenic Moras, PA-C  chlordiazePOXIDE (LIBRIUM) 25 MG capsule 50mg  PO TID x 1D, then 25-50mg  PO BID X 1D, then 25-50mg  PO QD X 1D 05/14/15   Merrily Pew, MD  citalopram (CELEXA) 20 MG tablet Take 20 mg by mouth.    Historical Provider, MD  clonazePAM (KLONOPIN) 1 MG tablet Take 1 mg by mouth as needed for anxiety.    Historical Provider, MD  diltiazem (CARDIZEM CD) 120 MG 24 hr capsule Take 120 mg by mouth.    Historical Provider, MD  gabapentin (NEURONTIN) 400 MG capsule Take 400 mg by mouth 3 (three) times daily.    Historical Provider, MD  HYDROcodone-acetaminophen (NORCO/VICODIN) 5-325 MG tablet Take 1-2 tablets by mouth every 6 hours as needed for pain and/or cough. 07/01/15   Christalyn Goertz, PA-C  ibuprofen (ADVIL,MOTRIN) 800 MG tablet Take 1 tablet (800 mg total) by mouth every 8 (eight) hours as needed for moderate pain. 07/13/15   Domenic Moras, PA-C  LORazepam (ATIVAN) 1 MG tablet Take 1 tablet (1 mg total) by mouth every 12 (twelve) hours as needed for anxiety. 05/14/15   Merrily Pew, MD  ondansetron (ZOFRAN ODT) 8 MG disintegrating tablet Take 1 tablet (8 mg total) by mouth every 8 (eight) hours as needed for nausea. Patient not taking: Reported on 05/14/2015 04/05/13   Linton Flemings, MD  QUEtiapine (SEROQUEL) 100 MG tablet Take 100 mg by mouth at bedtime.  Historical Provider, MD  sertraline (ZOLOFT) 100 MG tablet Take 100 mg by mouth daily.    Historical Provider, MD   BP 127/76 mmHg  Pulse 93  Temp(Src) 97.7 F (36.5 C) (Oral)  Resp 18  SpO2 97% Physical Exam  Constitutional: He is oriented to person, place, and time. He appears well-developed and well-nourished. No distress.  HENT:  Head: Normocephalic and atraumatic.  Mouth/Throat: Oropharynx is clear and moist.  Generally poor dentition, deep dental caries especially on the left lower jaw extending into the gumline., no gingival swelling, erythema or tenderness to palpation. Patient is handling their secretions. There  is no tenderness to palpation or firmness underneath tongue bilaterally. No trismus.    Eyes: Conjunctivae and EOM are normal. Pupils are equal, round, and reactive to light.  Neck: Normal range of motion. No tracheal deviation present.  Cardiovascular: Normal rate.   Pulmonary/Chest: Effort normal. No stridor.  Abdominal: Soft.  Musculoskeletal: Normal range of motion.  Lymphadenopathy:    He has no cervical adenopathy.  Neurological: He is alert and oriented to person, place, and time.  Skin: Skin is warm and dry.  Psychiatric: He has a normal mood and affect. His behavior is normal.  Nursing note and vitals reviewed.  ED Course  .Nerve Block Date/Time: 08/02/2015 3:56 PM Performed by: Monico Blitz Authorized by: Monico Blitz Consent: Verbal consent obtained. Risks and benefits: risks, benefits and alternatives were discussed Consent given by: patient Patient identity confirmed: verbally with patient Indications: pain relief Body area: face/mouth Nerve: inferior alveolar Laterality: left Preparation: Patient was prepped and draped in the usual sterile fashion. Patient position: sitting Needle gauge: 22 G Location technique: anatomical landmarks Anesthetic total: 1.8 ml Outcome: pain improved Patient tolerance: Patient tolerated the procedure well with no immediate complications   (including critical care time) DIAGNOSTIC STUDIES: Oxygen Saturation is 97% on RA, normal by my interpretation.    COORDINATION OF CARE: 11:58 AM-Discussed treatment plan which includes dental block, Amoxil, Norco and f/u with dentist with pt at bedside and pt agreed to plan.   Labs Review Labs Reviewed - No data to display  Imaging Review No results found.   EKG Interpretation None      MDM   Final diagnoses:  Pain due to dental caries    Filed Vitals:   08/02/15 1143  BP: 127/76  Pulse: 93  Temp: 97.7 F (36.5 C)  TempSrc: Oral  Resp: 18  SpO2: 97%     Medications  amoxicillin (AMOXIL) capsule 500 mg (500 mg Oral Given 08/02/15 1220)  bupivacaine-epinephrine (MARCAINE W/ EPI) 0.5% -1:200000 injection 1.8 mL (1.8 mLs Infiltration Given 08/02/15 1220)    Alejandro Flores is 35 y.o. male presenting with dental pain associated with dental caries but no signs or symptoms of dental abscess. Patient afebrile, non toxic appearing and swallowing secretions well. I gave patient referral to dentist and stressed the importance of dental follow up for definitive management of dental issues. Patient voices understanding and is agreeable to plan.  Evaluation does not show pathology that would require ongoing emergent intervention or inpatient treatment. Pt is hemodynamically stable and mentating appropriately. Discussed findings and plan with patient/guardian, who agrees with care plan. All questions answered. Return precautions discussed and outpatient follow up given.   I personally performed the services described in this documentation, which was scribed in my presence. The recorded information has been reviewed and is accurate.    Monico Blitz, PA-C 08/02/15 1557  Charlesetta Shanks, MD 08/03/15 [phone]

## 2015-08-06 ENCOUNTER — Ambulatory Visit (INDEPENDENT_AMBULATORY_CARE_PROVIDER_SITE_OTHER): Payer: Self-pay | Admitting: Internal Medicine

## 2015-08-06 ENCOUNTER — Encounter: Payer: Self-pay | Admitting: Internal Medicine

## 2015-08-06 VITALS — BP 124/86 | HR 78 | Ht 69.0 in | Wt 216.0 lb

## 2015-08-06 DIAGNOSIS — F418 Other specified anxiety disorders: Secondary | ICD-10-CM | POA: Insufficient documentation

## 2015-08-06 DIAGNOSIS — N200 Calculus of kidney: Secondary | ICD-10-CM | POA: Insufficient documentation

## 2015-08-06 DIAGNOSIS — K859 Acute pancreatitis without necrosis or infection, unspecified: Secondary | ICD-10-CM | POA: Insufficient documentation

## 2015-08-06 DIAGNOSIS — M109 Gout, unspecified: Secondary | ICD-10-CM | POA: Insufficient documentation

## 2015-08-06 DIAGNOSIS — F32A Depression, unspecified: Secondary | ICD-10-CM | POA: Insufficient documentation

## 2015-08-06 DIAGNOSIS — K029 Dental caries, unspecified: Secondary | ICD-10-CM

## 2015-08-06 DIAGNOSIS — I480 Paroxysmal atrial fibrillation: Secondary | ICD-10-CM

## 2015-08-06 DIAGNOSIS — Z23 Encounter for immunization: Secondary | ICD-10-CM

## 2015-08-06 DIAGNOSIS — F410 Panic disorder [episodic paroxysmal anxiety] without agoraphobia: Secondary | ICD-10-CM

## 2015-08-06 DIAGNOSIS — F952 Tourette's disorder: Secondary | ICD-10-CM | POA: Insufficient documentation

## 2015-08-06 DIAGNOSIS — F1021 Alcohol dependence, in remission: Secondary | ICD-10-CM | POA: Insufficient documentation

## 2015-08-06 DIAGNOSIS — F1020 Alcohol dependence, uncomplicated: Secondary | ICD-10-CM

## 2015-08-06 DIAGNOSIS — F419 Anxiety disorder, unspecified: Secondary | ICD-10-CM

## 2015-08-06 DIAGNOSIS — G8929 Other chronic pain: Secondary | ICD-10-CM

## 2015-08-06 DIAGNOSIS — F329 Major depressive disorder, single episode, unspecified: Secondary | ICD-10-CM

## 2015-08-06 MED ORDER — CLONAZEPAM 1 MG PO TABS: ORAL_TABLET | ORAL | Status: DC

## 2015-08-06 MED ORDER — GABAPENTIN 300 MG PO CAPS: 300.0000 mg | ORAL_CAPSULE | Freq: Three times a day (TID) | ORAL | Status: DC

## 2015-08-06 MED ORDER — DILTIAZEM HCL ER COATED BEADS 120 MG PO CP24: ORAL_CAPSULE | ORAL | Status: DC

## 2015-08-06 NOTE — Patient Instructions (Addendum)
Can google "advance directives, [place]"  And bring up form from Secretary of [place]. Print and fill out Or can go to "5 wishes"  Which is also in Spanish and fill out--this costs $5--perhaps easier to use. Designate a Forensic scientist to speak for you if you are unable to speak for yourself when ill or injured  Start weaning your clonazepam--this will be the only prescription for this I write.  I would decrease to 1/2 tab twice daily for a couple of days, then once daily, then only as needed.

## 2015-08-06 NOTE — Progress Notes (Signed)
Subjective:    Patient ID: Alejandro Flores, male    DOB: 07/28/1980, 35 y.o.   MRN: [id number]  HPI   1.  Left bottom molar broke off last week.  Basically down to the gumline.  Having constant pain.  Taking Tylenol 4,000 mg in 24 hour period due to the pain.  Was told to stay away from NSAIDS when suffered a bout of pancreatitis of unknown etiology about 2 years ago.  Developed a pseudocyst, which has since resolved.   Is taking Amoxicillin he had at home (per patient)  But actually can see he was seen on the 9th at [facility] where this was prescribed.   All of this care was at [facility].  At one point followed by a Dr. Truman Hayward at [facility].  May have also followed with a Gastroenterologist.   2.  Alcoholism:  Completely abstaining pt. States for 110 days.  States had inpatient treatment (pt. [place]) during 04/2015 where he stopped drinking and was observed for withdrawal.  Was hospitalized for 72 hours.    Followed by [facility] as inpatient for 28 days.   Now involved with AA.  Goes 5 days weekly.  States he is acquiring a sponsor.   See pancreatitis history above--appears to be related to ETOH in his records.  3.  Depression:  Taking Sertraline for depression for about 6 weeks and Seroquel XR 150 mg at bedtime.  Is not aware of a diagnosis of Bipolar Disorder--was switched to Seroquel from Trazodone as he was having odd dreams and not sleeping well.  Followed at [facility] for past month.  Physician on pill bottles is Gracy Racer, M.D.  Pt. 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   Has not been getting Clonazepam from [facility].  States he has been taking 1 tab every 8 hours.    4.  Atrial Fibrillation/flutter:  Appears this has been a problem when drinking or withdrawing from alcohol in his records.  Has been out of Cardizem used to control rate and rhythm.  5.   Extreme body aches for which he takes Gabapentin 400 mg three times daily.  Was started on this about 90 days.  Not clear what exactly the diagnosis for this medication is.           Review of Systems     Objective:   Physical Exam HEENT:  PERRL, TMs pearly gray, throat without injection. Severe dental decay wit most of his molars broken off at gum line with deep cavities.  Many missing teeth.  No gingival fluctuance. Neck:  Supple, no adenopathy Chest:  CTA CV:  RRR with normal S1 and S2, no S3, S4 or murmur appreciated.  Radial pulses normal and equal. Abd:  S, NT, No HSM or masses appreciated, +BS throughout.        Assessment & Plan:  1.  Severe Dental decay:  Pt. Initially stated he was using antibiotics from an old prescription.  Later clear he want to the ED on the 9th and was given the prescription.  Not sure why he felt the need to give this false history. Referral to dental--urgent  2.  Alcoholism:  Pt. initially gave history that he did not have an alcohol problem.  When found his history in chart, seemed to give information thereafter that appears similar to that in his charts.  He has obtained health care at [facility], [facility], [place] in recent years.  Hopefully, he is following with AA as he states and has removed himself from an environment of drinking as he states.  3.  ?Paroxymal Afib: unable to find good history of this in all of the ED visits.  May need to have him sign a release to obtain a history and physical from [facility] to get a better idea if this just occurs with alcohol abuse or withdrawal.  Echo in 2013 and 2014 were essentially normal.  No Atrial enlargement or significant valvular disease.  Refill Diltiazem CD 120 mg.  4.  Anxiety Disorder/Depression:  I will only fill Clonazepam for 20 tabs.  He is to wean from this.  Release of info with Beverly Sessions to share information.  5.  Chronic myalgias?  Pt. Cannot give a good history as to  why he is taking Gabapentin.  Unable to get hold of the 400 mg capsules he was taking.  To move to 300 mg three times daily.  Will eventually wean as he tolerates.

## 2015-08-30 ENCOUNTER — Encounter (HOSPITAL_COMMUNITY): Payer: Self-pay | Admitting: Emergency Medicine

## 2015-08-30 ENCOUNTER — Emergency Department (HOSPITAL_COMMUNITY)
Admission: EM | Admit: 2015-08-30 | Discharge: 2015-08-30 | Disposition: A | Discharge status: Home / Self Care | Payer: Self-pay | Attending: Emergency Medicine | Admitting: Emergency Medicine

## 2015-08-30 ENCOUNTER — Emergency Department (HOSPITAL_COMMUNITY)
Admission: EM | Admit: 2015-08-30 | Discharge: 2015-08-30 | Disposition: A | Discharge status: Home / Self Care | Payer: Self-pay

## 2015-08-30 DIAGNOSIS — Z8739 Personal history of other diseases of the musculoskeletal system and connective tissue: Secondary | ICD-10-CM | POA: Insufficient documentation

## 2015-08-30 DIAGNOSIS — Z79899 Other long term (current) drug therapy: Secondary | ICD-10-CM | POA: Insufficient documentation

## 2015-08-30 DIAGNOSIS — F17210 Nicotine dependence, cigarettes, uncomplicated: Secondary | ICD-10-CM | POA: Insufficient documentation

## 2015-08-30 DIAGNOSIS — Z792 Long term (current) use of antibiotics: Secondary | ICD-10-CM | POA: Insufficient documentation

## 2015-08-30 DIAGNOSIS — Z8781 Personal history of (healed) traumatic fracture: Secondary | ICD-10-CM | POA: Insufficient documentation

## 2015-08-30 DIAGNOSIS — F419 Anxiety disorder, unspecified: Secondary | ICD-10-CM | POA: Insufficient documentation

## 2015-08-30 DIAGNOSIS — K0889 Other specified disorders of teeth and supporting structures: Secondary | ICD-10-CM

## 2015-08-30 DIAGNOSIS — F329 Major depressive disorder, single episode, unspecified: Secondary | ICD-10-CM | POA: Insufficient documentation

## 2015-08-30 DIAGNOSIS — I4891 Unspecified atrial fibrillation: Secondary | ICD-10-CM | POA: Insufficient documentation

## 2015-08-30 DIAGNOSIS — K029 Dental caries, unspecified: Secondary | ICD-10-CM | POA: Insufficient documentation

## 2015-08-30 MED ORDER — PENICILLIN V POTASSIUM 500 MG PO TABS: 500.0000 mg | ORAL_TABLET | Freq: Four times a day (QID) | ORAL | Status: DC

## 2015-08-30 MED ORDER — TRAMADOL HCL 50 MG PO TABS: 50.0000 mg | ORAL_TABLET | Freq: Four times a day (QID) | ORAL | Status: DC | PRN

## 2015-08-30 MED ORDER — IBUPROFEN 800 MG PO TABS: 800.0000 mg | ORAL_TABLET | Freq: Three times a day (TID) | ORAL | Status: DC | PRN

## 2015-08-30 NOTE — Discharge Instructions (Signed)
Return here as needed.  There was no oral surgeon on-call today.  Follow-up with someone as soon as possible

## 2015-08-30 NOTE — ED Provider Notes (Signed)
CSN: [id number]     Arrival date & time 08/30/15  1153 History  By signing my name below, I, Altamease Oiler, attest that this documentation has been prepared under the direction and in the presence of [employer]. Electronically Signed: Altamease Oiler, ED Scribe. 08/30/2015 12:36 PM   Chief Complaint  Patient presents with  . Dental Pain    The history is provided by the patient. No language interpreter was used.   Alejandro Flores is a 35 y.o. male who presents to the Emergency Department complaining of constant, 8/10 in severity, lower left dental pain with onset more than 1 month ago after a dental fracture. Pt states that given the nature of his health insurance he needs a referral to see a dentist and the process will take weeks. Pt denies fever, chills, and difficulty swallowing or breathing.   Past Medical History  Diagnosis Date  . Atrial fibrillation ([place])     Appears associated with alcohol abuse and withdrawal  . Pancreatic pseudocyst/cyst     Spring of 2015 per patient- states at [place]   . Depression     Has a Social worker at [employer].  . Gout 2011-2012  . ETOH abuse 2013    Relates to separation/divorce    Past Surgical History  Procedure Laterality Date  . Colonoscopy with polypectomy  2016    [facility]--had rectal bleeding and one was precancerous   Family History  Problem Relation Age of Onset  . Breast cancer Mother 75  . Hyperlipidemia Mother   . Anxiety disorder Mother   . Alcohol abuse Father   . Heart disease Father 54    Triple bypass   . COPD Father     chronic smoker  . Alcohol abuse Brother   . Depression Brother    Social History  Substance Use Topics  . Smoking status: Current Every Day Smoker -- 0.50 packs/day for 4 years    Types: Cigarettes  . Smokeless tobacco: Never Used  . Alcohol Use: No     Comment: Quit 110 days ago with treatment at [place].    Review of Systems  All  other systems negative except as documented in the HPI. All pertinent positives and negatives as reviewed in the HPI  Allergies  Review of patient's allergies indicates no known allergies.  Home Medications   Prior to Admission medications   Medication Sig Start Date End Date Taking? Authorizing Provider  acetaminophen (TYLENOL) 325 MG tablet Take 2 tablets (650 mg total) by mouth every 6 (six) hours as needed. 06/15/15   Courteney Lyn Mackuen, MD  amoxicillin (AMOXIL) 500 MG capsule Take 2 capsules (1,000 mg total) by mouth 2 (two) times daily. 08/02/15   Nicole Pisciotta, PA-C  clonazePAM (KLONOPIN) 1 MG tablet 1/2 to 1 tab twice daily as needed for anxiety.   Use only as needed. This will be the last prescription 08/06/15   Mack Hook, MD  diltiazem [facility] CD) 120 MG 24 hr capsule 1 tab once daily by mouth 08/06/15   Mack Hook, MD  gabapentin (NEURONTIN) 300 MG capsule Take 1 capsule (300 mg total) by mouth 3 (three) times daily. 08/06/15   Mack Hook, MD  QUEtiapine Fumarate (SEROQUEL XR) 150 MG 24 hr tablet Take 150 mg by mouth at bedtime.    Historical Provider, MD  sertraline (ZOLOFT) 100 MG tablet Take 100 mg by mouth daily.    Historical Provider, MD  BP 135/76 mmHg  Pulse 89  Temp(Src) 98.1 F (36.7 C) (Oral)  Resp 16  SpO2 99% Physical Exam  Constitutional: He is oriented to person, place, and time. He appears well-developed and well-nourished. No distress.  HENT:  Head: Normocephalic and atraumatic.  First, second, and third molars decayed Gums inflamed No abscess No swelling of the tongue, throat, or floor of the mouth  Eyes: Conjunctivae and EOM are normal.  Neck: Neck supple. No tracheal deviation present.  No neck swelling noted  Cardiovascular: Normal rate.   Pulmonary/Chest: Effort normal. No respiratory distress.  Musculoskeletal: Normal range of motion.  Neurological: He is alert and oriented to person, place, and time.  Skin: Skin  is warm and dry.  Psychiatric: He has a normal mood and affect. His behavior is normal.  Nursing note and vitals reviewed.   ED Course  Procedures (including critical care time) DIAGNOSTIC STUDIES: Oxygen Saturation is 99% on RA,  normal by my interpretation.    COORDINATION OF CARE: 12:34 PM Discussed treatment plan which includes discharge with abx with pt at bedside and pt agreed to plan.  Patient will be advised to follow-up with oral surgery.  Told to return here as needed.  Patient agrees the plan and all questions were answered.  There is no definitive abscess this time.  Patient's throat is not swollen.  There is no swelling to the floor of his mouth   Dalia Heading, PA-C 08/30/15 [place], MD 08/30/15 [phone]

## 2015-08-30 NOTE — ED Notes (Signed)
Per pt, states left lower dental pain-sent here for antibiotics

## 2015-09-06 ENCOUNTER — Encounter: Payer: Self-pay | Admitting: Internal Medicine

## 2015-09-06 ENCOUNTER — Ambulatory Visit (INDEPENDENT_AMBULATORY_CARE_PROVIDER_SITE_OTHER): Payer: Self-pay | Admitting: Internal Medicine

## 2015-09-06 VITALS — BP 118/82 | HR 80 | Ht 69.0 in | Wt 214.0 lb

## 2015-09-06 DIAGNOSIS — F410 Panic disorder [episodic paroxysmal anxiety] without agoraphobia: Secondary | ICD-10-CM

## 2015-09-06 DIAGNOSIS — F411 Generalized anxiety disorder: Secondary | ICD-10-CM

## 2015-09-06 DIAGNOSIS — F959 Tic disorder, unspecified: Secondary | ICD-10-CM

## 2015-09-06 DIAGNOSIS — D4989 Neoplasm of unspecified behavior of other specified sites: Secondary | ICD-10-CM

## 2015-09-06 MED ORDER — CLONAZEPAM 1 MG PO TABS: ORAL_TABLET | ORAL | Status: DC

## 2015-09-06 NOTE — Progress Notes (Signed)
Subjective:    Patient ID: Alejandro Flores, male    DOB: 05/26/1981, 35 y.o.   MRN: [id number]  HPI 1.  Bump behind right ear.  Noted 5 years ago.  Over the years has grown to be quite a bit larger.  Was noted where he donates plasma.  He is not allowed to donate until he is cleared regarding the lesion. Sometimes has discomfort when lies against things.  Never drained--no opening.  Cannot say it has ever turned red.  More noticeable now that his hair is much shorter.  2.  Anxiety/Tics:  Forgot about tics, including light vocalizations becomes a problem when his anxiety has kicked up. This has become very prominent for him since he stopped the Clonazepam about 3 weeks ago--did a gradual wean.  He does not note the tics so much, but other around him do.   He has had this since he was about 35 yo.  Was not on any meds at the time.  No history of ADHD or treatment for such.  He was binge drinking by that time in his life.   Has noted some mild vocalizations in the past as well.  Has eye blinking, mouth movements and shoulder twitching as well.  All of this is worse whenever he is anxious.   The Clonazepam helped controlled this. He did see his psychiatrist about 3 weeks ago and Seroquel was increased to 200 mg with plans to return to Clonazepam if anxiety not controlled.  When asked if history of OCD symptoms:  Pt.  admits to repeating old addresses, telephone numbers, SS # over and over in his head to calm himself.   Knocking on wood --if he would say this, he would need to find real wood in the home to knock on before he felt comfortable.  Has been very obsessive about wrinkles in clothing.  Would not wear clothes even if mildly wrinkled.    Current outpatient prescriptions:  .  acetaminophen (TYLENOL) 325 MG tablet, Take 2 tablets (650 mg total) by mouth every 6 (six) hours as needed., Disp: 14 tablet, Rfl: 0 .  diltiazem (CARDIZEM CD) 120 MG 24 hr capsule, 1 tab once daily by mouth, Disp: 30  capsule, Rfl: 11 .  gabapentin (NEURONTIN) 300 MG capsule, Take 1 capsule (300 mg total) by mouth 3 (three) times daily., Disp: 90 capsule, Rfl: 3 .  QUEtiapine Fumarate (SEROQUEL XR) 150 MG 24 hr tablet, Take 200 mg by mouth at bedtime. , Disp: , Rfl:  .  sertraline (ZOLOFT) 100 MG tablet, Take 100 mg by mouth daily., Disp: , Rfl:  .  clonazePAM (KLONOPIN) 1 MG tablet, 1/2 tab by mouth twice daily as needed for anxiety, Disp: 30 tablet, Rfl: 0 .  traMADol (ULTRAM) 50 MG tablet, Take 1 tablet (50 mg total) by mouth every 6 (six) hours as needed for severe pain. (Patient not taking: Reported on 09/06/2015), Disp: 15 tablet, Rfl: 0         Review of Systems     Objective:   Physical Exam NAD Head:  6 x 4 cm soft lesion at right lower occiput.  Feels more like a fluid filled lesion rather than soft tissue.  Has a bony thickening or bony collar or "lipping"  Around the edge of the lesion.  Nontender on palpation.  Overlying skin and hair appear normal.   Neck:  Supple, no adenopathy Neuro:  Alert and oriented X3.  Frequent rapid blinking of eyes, with opening  of mouth and shrugging of shoulder at intervals.  No current vocalizations.  CN II-XII grossly intact, Motor 5/5 throughout, DTRs 2+/4 throughout, Romberg negative.  Gait normal       Assessment & Plan:  1.  Head Lesion:  Not clear what this is.  Feels almost fluid filled, but cannot rule out lipoma.  Check skull film with bony collar about lesion.  If appears normal and no obvious opening of skull, which feel is unlikely, will send for removal by surgery.  2.  Anxiety with Tic Disorder, though cannot definitively make diagnosis of Tourette's--however, patient apparently was diagnosed with that in the past based on problem list we filled out at last visit--though he does not state that today :  Restart Clonazepam at 0.5 mg twice daily.  Recommend [facility]- for pricing of$10.  As he does not feel as well on the 200 mg of Seroquel  XR and has plenty of the 150 mg at home, may switch to the lower dose as adds Clonzepam back.

## 2015-09-07 ENCOUNTER — Telehealth: Payer: Self-pay

## 2015-09-07 NOTE — Telephone Encounter (Signed)
Patient dropped off form to fill out he forgot to give it to Md at his appointment yesterday. Sent to Md form on desk

## 2015-09-10 NOTE — Telephone Encounter (Signed)
Sent to Md

## 2015-09-12 NOTE — Telephone Encounter (Signed)
Filled out and patient picked up yesterday

## 2015-09-15 ENCOUNTER — Encounter (HOSPITAL_COMMUNITY): Payer: Self-pay

## 2015-09-15 ENCOUNTER — Emergency Department (HOSPITAL_COMMUNITY): Payer: Self-pay

## 2015-09-15 ENCOUNTER — Emergency Department (HOSPITAL_COMMUNITY)
Admission: EM | Admit: 2015-09-15 | Discharge: 2015-09-15 | Discharge status: Against Medical Advice | Payer: Self-pay | Attending: Emergency Medicine | Admitting: Emergency Medicine

## 2015-09-15 DIAGNOSIS — R079 Chest pain, unspecified: Secondary | ICD-10-CM | POA: Insufficient documentation

## 2015-09-15 DIAGNOSIS — M549 Dorsalgia, unspecified: Secondary | ICD-10-CM | POA: Insufficient documentation

## 2015-09-15 DIAGNOSIS — K0381 Cracked tooth: Secondary | ICD-10-CM | POA: Insufficient documentation

## 2015-09-15 DIAGNOSIS — F329 Major depressive disorder, single episode, unspecified: Secondary | ICD-10-CM | POA: Insufficient documentation

## 2015-09-15 DIAGNOSIS — I4891 Unspecified atrial fibrillation: Secondary | ICD-10-CM | POA: Insufficient documentation

## 2015-09-15 DIAGNOSIS — F17210 Nicotine dependence, cigarettes, uncomplicated: Secondary | ICD-10-CM | POA: Insufficient documentation

## 2015-09-15 DIAGNOSIS — F419 Anxiety disorder, unspecified: Secondary | ICD-10-CM | POA: Insufficient documentation

## 2015-09-15 DIAGNOSIS — K029 Dental caries, unspecified: Secondary | ICD-10-CM | POA: Insufficient documentation

## 2015-09-15 LAB — BASIC METABOLIC PANEL
Anion gap: 10 (ref 5–15)
BUN: 8 mg/dL (ref 6–20)
CALCIUM: 8.9 mg/dL (ref 8.9–10.3)
CHLORIDE: 108 mmol/L (ref 101–111)
CO2: 23 mmol/L (ref 22–32)
CREATININE: 0.79 mg/dL (ref 0.61–1.24)
GFR calc non Af Amer: >60 mL/min (ref >60)
GLUCOSE: 87 mg/dL (ref 65–99)
Potassium: 4.2 mmol/L (ref 3.5–5.1)
Sodium: 141 mmol/L (ref 135–145)

## 2015-09-15 LAB — CBC
HCT: 47.5 % (ref 39.0–52.0)
Hemoglobin: 16.3 g/dL (ref 13.0–17.0)
MCH: 30.8 pg (ref 26.0–34.0)
MCHC: 34.3 g/dL (ref 30.0–36.0)
MCV: 89.8 fL (ref 78.0–100.0)
PLATELETS: 236 10*3/uL (ref 150–400)
RBC: 5.29 MIL/uL (ref 4.22–5.81)
RDW: 14.1 % (ref 11.5–15.5)
WBC: 14.8 10*3/uL — ABNORMAL HIGH (ref 4.0–10.5)

## 2015-09-15 LAB — I-STAT TROPONIN, ED: TROPONIN I, POC: 0 ng/mL (ref 0.00–0.08)

## 2015-09-15 MED ORDER — ASPIRIN 81 MG PO CHEW
650.0000 mg | CHEWABLE_TABLET | Freq: Once | ORAL | Status: DC
  Filled 2015-09-15: qty 9

## 2015-09-15 MED ORDER — ASPIRIN 81 MG PO CHEW
648.0000 mg | CHEWABLE_TABLET | Freq: Once | ORAL | Status: AC
  Administered 2015-09-15: 648 mg via ORAL

## 2015-09-15 NOTE — ED Notes (Signed)
Pt not in room.

## 2015-09-15 NOTE — ED Provider Notes (Signed)
CSN: [id number]     Arrival date & time 09/15/15  1137 History   First MD Initiated Contact with Patient 09/15/15 1234     Chief Complaint  Patient presents with  . Chest Pain  . Dental Pain  . Back Pain     (Consider location/radiation/quality/duration/timing/severity/associated sxs/prior Treatment) HPI Comments: 35 year old male who presents with dental pain and chest pain. The patient has a long history of dental caries and related problems. He states for the past 6-7 weeks, he has had pain in his left lower back molars. He was started on penicillin 3 days ago by his PCP and has an appointment next week with a dentist but he states that for the past several days his pain has been more severe. He denies any associated fevers, vomiting, or diarrhea. A few days ago, he fell that when his dental pain became severe, he started having some heart fluttering sensations that lasted approximately 30 seconds at a time. These sensations have become more constant as his pain has gotten worse and this morning at 4 AM he began having chest pain which feels like something is sitting on his chest. The pain is constant and radiates to his back. He denies any associated shortness of breath. No recent travel, history of cancer, or history of blood clots. Family history notable for father with CABG at age 56, grandmother with heart disease.  Patient is a 35 y.o. male presenting with chest pain, tooth pain, and back pain. The history is provided by the patient.  Chest Pain Associated symptoms: back pain   Dental Pain Back Pain Associated symptoms: chest pain     Past Medical History  Diagnosis Date  . Atrial fibrillation ([place])     Appears associated with alcohol abuse and withdrawal  . Pancreatic pseudocyst/cyst     Spring of 2015 per patient- states at [place]   . Depression     Has a Social worker at [employer].  . Gout 2011-2012  . ETOH abuse 2013    Relates to separation/divorce     Past Surgical History  Procedure Laterality Date  . Colonoscopy with polypectomy  2016    [facility]--had rectal bleeding and one was precancerous   Family History  Problem Relation Age of Onset  . Breast cancer Mother 28  . Hyperlipidemia Mother   . Anxiety disorder Mother   . Alcohol abuse Father   . Heart disease Father 51    Triple bypass   . COPD Father     chronic smoker  . Alcohol abuse Brother   . Depression Brother    Social History  Substance Use Topics  . Smoking status: Current Every Day Smoker -- 0.50 packs/day for 4 years    Types: Cigarettes  . Smokeless tobacco: Never Used  . Alcohol Use: No     Comment: Quit 110 days ago with treatment at [place].    Review of Systems  Cardiovascular: Positive for chest pain.  Musculoskeletal: Positive for back pain.    10 Systems reviewed and are negative for acute change except as noted in the HPI.   Allergies  Review of patient's allergies indicates no known allergies.  Home Medications   Prior to Admission medications   Medication Sig Start Date End Date Taking? Authorizing Provider  acetaminophen (TYLENOL) 325 MG tablet Take 2 tablets (650 mg total) by mouth every 6 (six) hours as needed. 06/15/15   Courteney Julio Alm, MD  clonazePAM (KLONOPIN) 1 MG tablet 1/2 tab by mouth twice daily as needed for anxiety 09/06/15   Mack Hook, MD  diltiazem (CARDIZEM CD) 120 MG 24 hr capsule 1 tab once daily by mouth 08/06/15   Mack Hook, MD  gabapentin (NEURONTIN) 300 MG capsule Take 1 capsule (300 mg total) by mouth 3 (three) times daily. 08/06/15   Mack Hook, MD  QUEtiapine Fumarate (SEROQUEL XR) 150 MG 24 hr tablet Take 200 mg by mouth at bedtime.     Historical Provider, MD  sertraline (ZOLOFT) 100 MG tablet Take 100 mg by mouth daily.    Historical Provider, MD  traMADol (ULTRAM) 50 MG tablet Take 1 tablet (50 mg total) by mouth every 6 (six) hours as needed  for severe pain. Patient not taking: Reported on 09/06/2015 08/30/15   Dalia Heading, PA-C   BP 119/83 mmHg  Pulse 75  Temp(Src) 98.2 F (36.8 C)  Resp 19  Ht 5\' 10"  (1.778 m)  Wt 214 lb (97.07 kg)  BMI 30.71 kg/m2  SpO2 100% Physical Exam  Constitutional: He is oriented to person, place, and time. He appears well-developed and well-nourished. No distress.  HENT:  Head: Normocephalic and atraumatic.  Moist mucous membranes, multiple dental caries and broken teeth, teeth #17,18,19 broken off at gumline, no mucosal swelling or asymmetry  Eyes: Conjunctivae are normal. Pupils are equal, round, and reactive to light.  Neck: Neck supple.  Cardiovascular: Normal rate, regular rhythm and normal heart sounds.   No murmur heard. Pulmonary/Chest: Effort normal and breath sounds normal.  Abdominal: Soft. Bowel sounds are normal. He exhibits no distension. There is no tenderness.  Musculoskeletal: He exhibits no edema.  Neurological: He is alert and oriented to person, place, and time.  Fluent speech  Skin: Skin is warm and dry.  Psychiatric: He has a normal mood and affect. Judgment normal.  Nursing note and vitals reviewed.   ED Course  Procedures (including critical care time) Labs Review Labs Reviewed  CBC - Abnormal; Notable for the following:    WBC 14.8 (*)    All other components within normal limits  BASIC METABOLIC PANEL  I-STAT TROPOININ, ED    Imaging Review Dg Chest 2 View  09/15/2015  CLINICAL DATA:  Pt. reports having dental pain and when the pain increases he can feel his heart go into an arrythmia. He has a hx of SVT. He also is having chest pain that radiates into his back. EXAM: CHEST  2 VIEW COMPARISON:  06/15/2015 FINDINGS: Normal mediastinum and cardiac silhouette. Normal pulmonary vasculature. No evidence of effusion, infiltrate, or pneumothorax. No acute bony abnormality. IMPRESSION: Normal chest radiograph. Electronically Signed   By: Suzy Bouchard M.D.    On: 09/15/2015 12:56   I have personally reviewed and evaluated these lab results as part of my medical decision-making.   EKG Interpretation   Date/Time:  Saturday September 15 2015 11:46:03 EDT Ventricular Rate:  86 PR Interval:  144 QRS Duration: 94 QT Interval:  358 QTC Calculation: 428 R Axis:   89 Text Interpretation:  Normal sinus rhythm Normal ECG since previous  tracing rate has slowed Confirmed by Shantanu Strauch MD, Vaani Morren [phone]) on  09/15/2015 12:34:36 PM     Medications  aspirin chewable tablet 648 mg (648 mg Oral Given 09/15/15 1300)    MDM   Final diagnoses:  Pain due to dental caries  Chest pain, unspecified chest pain type   Patient presents with weeks of dental pain that has been worse  over the past few days as well as chest pain that has been constant since this morning. He was well-appearing with normal vital signs at presentation. Unremarkable EKG. He had multiple dental caries and broken teeth numbers 17, 18, 19. No mucosal swelling to suggest periapical abscess. No fevers or neck swelling to suggest deep space infection. Gave the patient aspirin and obtained chest x-ray which was unremarkable. Initial lab work showed normal troponin, WBC 14.8 but otherwise unremarkable. I explained that the patient is already on antibiotics and his definitive management would need to be performed by a dentist. Regarding his pain control, I stated that I do not provide narcotics for dental pain but discussed alternating Tylenol and Motrin until dentist follow-up. Regarding his chest pain, I discussed plan including serial troponins, given family hx.The patient has no risk factors for PE and is PERC negative. The patient agreed to plan, however after I left the room the patient eloped.  Sharlett Iles, MD 09/15/15 [phone]

## 2015-09-15 NOTE — ED Notes (Addendum)
Pt not in room, per Wynelle Link, RN. Pt left after being told by ED physician that he is not getting any narcotics.

## 2015-09-15 NOTE — ED Notes (Signed)
Pt. Reports having dental pain and when thepain increases he can feel his heart go into an arrythmia.  He has a hx of SVT.  He also is having chest pain that radiates into his back.  Skin is p/w/d, ECG completed in triage. Pt. Denies any n/v/d/ or sob.

## 2015-09-17 ENCOUNTER — Telehealth: Payer: Self-pay | Admitting: Internal Medicine

## 2015-09-17 DIAGNOSIS — T85848D Pain due to other internal prosthetic devices, implants and grafts, subsequent encounter: Secondary | ICD-10-CM

## 2015-09-17 MED ORDER — PENICILLIN V POTASSIUM 500 MG PO TABS: 500.0000 mg | ORAL_TABLET | Freq: Four times a day (QID) | ORAL | Status: DC

## 2015-09-17 NOTE — Telephone Encounter (Signed)
Patient has dental appointment on April 3rd - but is having tooth pain.  Would like to know if he could get something to relieve the tooth pain until he goes to dental referral.

## 2015-10-01 ENCOUNTER — Telehealth: Payer: Self-pay | Admitting: Internal Medicine

## 2015-10-01 DIAGNOSIS — F410 Panic disorder [episodic paroxysmal anxiety] without agoraphobia: Secondary | ICD-10-CM

## 2015-10-01 NOTE — Telephone Encounter (Signed)
Patient wants refill on Rx "clonazepam" 1 mg. Tab.  Patient wants Rx called into [facility] on [address] [phone].

## 2015-10-03 MED ORDER — CLONAZEPAM 1 MG PO TABS: ORAL_TABLET | ORAL | Status: DC

## 2015-10-04 NOTE — Telephone Encounter (Signed)
Telephone number for patient is not working

## 2015-10-05 ENCOUNTER — Emergency Department (HOSPITAL_COMMUNITY)
Admission: EM | Admit: 2015-10-05 | Discharge: 2015-10-05 | Disposition: A | Discharge status: Home / Self Care | Payer: Self-pay | Attending: Emergency Medicine | Admitting: Emergency Medicine

## 2015-10-05 ENCOUNTER — Encounter (HOSPITAL_COMMUNITY): Payer: Self-pay | Admitting: Emergency Medicine

## 2015-10-05 DIAGNOSIS — M109 Gout, unspecified: Secondary | ICD-10-CM | POA: Insufficient documentation

## 2015-10-05 DIAGNOSIS — K002 Abnormalities of size and form of teeth: Secondary | ICD-10-CM | POA: Insufficient documentation

## 2015-10-05 DIAGNOSIS — Z79899 Other long term (current) drug therapy: Secondary | ICD-10-CM | POA: Insufficient documentation

## 2015-10-05 DIAGNOSIS — F17210 Nicotine dependence, cigarettes, uncomplicated: Secondary | ICD-10-CM | POA: Insufficient documentation

## 2015-10-05 DIAGNOSIS — K0889 Other specified disorders of teeth and supporting structures: Secondary | ICD-10-CM | POA: Insufficient documentation

## 2015-10-05 DIAGNOSIS — K029 Dental caries, unspecified: Secondary | ICD-10-CM | POA: Insufficient documentation

## 2015-10-05 DIAGNOSIS — F419 Anxiety disorder, unspecified: Secondary | ICD-10-CM | POA: Insufficient documentation

## 2015-10-05 DIAGNOSIS — I4891 Unspecified atrial fibrillation: Secondary | ICD-10-CM | POA: Insufficient documentation

## 2015-10-05 DIAGNOSIS — F329 Major depressive disorder, single episode, unspecified: Secondary | ICD-10-CM | POA: Insufficient documentation

## 2015-10-05 MED ORDER — IBUPROFEN 800 MG PO TABS: 800.0000 mg | ORAL_TABLET | Freq: Three times a day (TID) | ORAL | Status: DC

## 2015-10-05 MED ORDER — HYDROCODONE-ACETAMINOPHEN 5-325 MG PO TABS: 2.0000 | ORAL_TABLET | ORAL | Status: DC | PRN

## 2015-10-05 MED ORDER — PENICILLIN V POTASSIUM 500 MG PO TABS: 500.0000 mg | ORAL_TABLET | Freq: Two times a day (BID) | ORAL | Status: AC

## 2015-10-05 MED ORDER — BUPIVACAINE-EPINEPHRINE (PF) 0.5% -1:200000 IJ SOLN
1.8000 mL | Freq: Once | INTRAMUSCULAR | Status: AC
  Administered 2015-10-05: 1.8 mL
  Filled 2015-10-05: qty 1.8

## 2015-10-05 NOTE — Discharge Instructions (Signed)
Take your medications as prescribed. I recommend eating prior to taking her ibuprofen to prevent gastrointestinal side effects. I also recommend applying ice to affected area for 15-20 minutes 3-4 times daily to help with pain and swelling. Follow-up with your dentist at your scheduled appointment next week. Return to emergency department if symptoms worsen or new onset of fever, facial/neck swelling, drainage, difficulty or unable to swallow due to swelling or pain, unable tolerate fluids, difficulty breathing.

## 2015-10-05 NOTE — ED Notes (Signed)
Patient here with complaints of lower left dental pain x3 days. Reports that he is unable to get an appointment with his dentist soon enough. Pain 10/10.

## 2015-10-05 NOTE — ED Provider Notes (Signed)
CSN: [id number]     Arrival date & time 10/05/15  1243 History  By signing my name below, I, [name], attest that this documentation has been prepared under the direction and in the presence of [name], PA-C. Electronically Signed: [name], ED Scribe. 10/05/2015. 3:42 PM.   Chief Complaint  Patient presents with  . Dental Pain   The history is provided by the patient. No language interpreter was used.     HPI Comments: Alejandro Flores is a 35 y.o. male who presents to the Emergency Department complaining of gradual onset, constant, left lower dental pain x 1 day. Pt reports fracturing the tooth a couple of weeks ago. He has been having intermittent mild pain since then but reports that it worsened yesterday. He also complains of a foul taste in his mouth. Pt has been taking 800 mg Ibuprofen with some relief. He also recently finished a course of Penicillin 8-9 days ago. Pt is scheduled to have front partial teeth extraction on 04/18 (approximately 4 days from now) and 5 teeth removed on both the right and left lower side in May by an oral surgeon. He attempted to call both his dentist and PCP today but they are closed due to the holiday, prompting him to come to the ED. Denies fever, chills, difficulty swallowing, drooling, shortness of breath, facial swelling, neck swelling, or any other associated symptoms.    Past Medical History  Diagnosis Date  . Atrial fibrillation ([place])     Appears associated with alcohol abuse and withdrawal  . Pancreatic pseudocyst/cyst     Spring of 2015 per patient- states at [place]   . Depression     Has a Social worker at [employer].  . Gout 2011-2012  . ETOH abuse 2013    Relates to separation/divorce    Past Surgical History  Procedure Laterality Date  . Colonoscopy with polypectomy  2016    [facility]--had rectal bleeding and one was precancerous   Family History  Problem Relation Age of Onset  . Breast cancer  Mother 73  . Hyperlipidemia Mother   . Anxiety disorder Mother   . Alcohol abuse Father   . Heart disease Father 92    Triple bypass   . COPD Father     chronic smoker  . Alcohol abuse Brother   . Depression Brother    Social History  Substance Use Topics  . Smoking status: Current Every Day Smoker -- 0.50 packs/day for 4 years    Types: Cigarettes  . Smokeless tobacco: Never Used  . Alcohol Use: No     Comment: Quit 110 days ago with treatment at [place].    Review of Systems  Constitutional: Negative for fever and chills.  HENT: Positive for dental problem. Negative for drooling and trouble swallowing.   Respiratory: Negative for shortness of breath.    Allergies  Review of patient's allergies indicates no known allergies.  Home Medications   Prior to Admission medications   Medication Sig Start Date End Date Taking? Authorizing Provider  acetaminophen (TYLENOL) 325 MG tablet Take 2 tablets (650 mg total) by mouth every 6 (six) hours as needed. 06/15/15   Courteney Lyn Mackuen, MD  clonazePAM (KLONOPIN) 1 MG tablet 1/2 tab by mouth twice daily as needed for anxiety 10/03/15   Mack Hook, MD  diltiazem [facility] CD) 120 MG 24 hr capsule 1 tab once daily by mouth 08/06/15   Mack Hook,  MD  gabapentin (NEURONTIN) 300 MG capsule Take 1 capsule (300 mg total) by mouth 3 (three) times daily. 08/06/15   Mack Hook, MD  HYDROcodone-acetaminophen (NORCO/VICODIN) 5-325 MG tablet Take 2 tablets by mouth every 4 (four) hours as needed. 10/05/15   Nona Dell, PA-C  ibuprofen (ADVIL,MOTRIN) 800 MG tablet Take 1 tablet (800 mg total) by mouth 3 (three) times daily. 10/05/15   Nona Dell, PA-C  penicillin v potassium (VEETID) 500 MG tablet Take 1 tablet (500 mg total) by mouth 2 (two) times daily. 10/05/15 10/12/15  Nona Dell, PA-C  QUEtiapine Fumarate (SEROQUEL XR) 150 MG 24 hr tablet Take 200 mg by mouth at  bedtime.     Historical Provider, MD  sertraline (ZOLOFT) 100 MG tablet Take 100 mg by mouth daily.    Historical Provider, MD  traMADol (ULTRAM) 50 MG tablet Take 1 tablet (50 mg total) by mouth every 6 (six) hours as needed for severe pain. Patient not taking: Reported on 09/06/2015 08/30/15   Dalia Heading, PA-C   BP 118/80 mmHg  Pulse 53  Temp(Src) 97.7 F (36.5 C) (Oral)  Resp 18  SpO2 100%   Physical Exam  Constitutional: He is oriented to person, place, and time. He appears well-developed and well-nourished.  HENT:  Head: Normocephalic and atraumatic.  Mouth/Throat: Uvula is midline, oropharynx is clear and moist and mucous membranes are normal. No oral lesions. No trismus in the jaw. Abnormal dentition. Dental caries present. No dental abscesses or uvula swelling. No oropharyngeal exudate, posterior oropharyngeal edema, posterior oropharyngeal erythema or tonsillar abscesses.    No facial or neck swelling  Eyes: Conjunctivae and EOM are normal. Right eye exhibits no discharge. Left eye exhibits no discharge. No scleral icterus.  Neck: Normal range of motion. Neck supple.  Cardiovascular: Normal rate.   Pulmonary/Chest: Effort normal. No stridor.  Lymphadenopathy:    He has no cervical adenopathy.  Neurological: He is alert and oriented to person, place, and time.  Nursing note and vitals reviewed.   ED Course  Procedures (including critical care time)  Dental Block  Consent: Verbal consent obtained. Risks and benefits: risks, benefits and alternatives were discussed Patient identity confirmed: provided demographic data  Time out performed prior to procedure  Administered 1.8 mL of 0.5% [marcaine w/ EPI].  Pt tolerated procedure well without complication.   DIAGNOSTIC STUDIES: Oxygen Saturation is 100% on RA, normal by my interpretation.    COORDINATION OF CARE: 3:38 PM-Discussed treatment plan which includes dental block and Rx Norco and antibiotics with pt at  bedside and pt agreed to plan.   Labs Review Labs Reviewed - No data to display  Imaging Review No results found.    EKG Interpretation None      MDM   Final diagnoses:  Pain, dental   Periodontal Exam     Patient with dentalgia.  No abscess requiring immediate incision and drainage.  Exam not concerning for Ludwig's angina or pharyngeal abscess. Patient given dental block in the ED without any complications and reports immediate relief of pain. Will treat with Rx Veetid, 800 mg Ibuprofen, and Norco. Pt instructed to follow-up with dentist at scheduled appointment next week.  Discussed return precautions. Pt safe for discharge.  I personally performed the services described in this documentation, which was scribed in my presence. The recorded information has been reviewed and is accurate.      Chesley Noon [place], [place] 10/05/15 1632  Harvel Quale, MD 10/07/15 1538

## 2015-10-07 ENCOUNTER — Emergency Department (HOSPITAL_BASED_OUTPATIENT_CLINIC_OR_DEPARTMENT_OTHER)
Admission: EM | Admit: 2015-10-07 | Discharge: 2015-10-07 | Disposition: A | Discharge status: Home / Self Care | Payer: Self-pay | Attending: Emergency Medicine | Admitting: Emergency Medicine

## 2015-10-07 ENCOUNTER — Encounter (HOSPITAL_BASED_OUTPATIENT_CLINIC_OR_DEPARTMENT_OTHER): Payer: Self-pay | Admitting: Emergency Medicine

## 2015-10-07 DIAGNOSIS — F329 Major depressive disorder, single episode, unspecified: Secondary | ICD-10-CM | POA: Insufficient documentation

## 2015-10-07 DIAGNOSIS — F17210 Nicotine dependence, cigarettes, uncomplicated: Secondary | ICD-10-CM | POA: Insufficient documentation

## 2015-10-07 DIAGNOSIS — F419 Anxiety disorder, unspecified: Secondary | ICD-10-CM | POA: Insufficient documentation

## 2015-10-07 DIAGNOSIS — K029 Dental caries, unspecified: Secondary | ICD-10-CM | POA: Insufficient documentation

## 2015-10-07 DIAGNOSIS — Z792 Long term (current) use of antibiotics: Secondary | ICD-10-CM | POA: Insufficient documentation

## 2015-10-07 DIAGNOSIS — Z79899 Other long term (current) drug therapy: Secondary | ICD-10-CM | POA: Insufficient documentation

## 2015-10-07 DIAGNOSIS — Y658 Other specified misadventures during surgical and medical care: Secondary | ICD-10-CM | POA: Insufficient documentation

## 2015-10-07 DIAGNOSIS — T85848A Pain due to other internal prosthetic devices, implants and grafts, initial encounter: Secondary | ICD-10-CM | POA: Insufficient documentation

## 2015-10-07 MED ORDER — BUPIVACAINE HCL (PF) 0.5 % IJ SOLN
1.8000 mL | Freq: Once | INTRAMUSCULAR | Status: AC
  Administered 2015-10-07: 15:00:00
  Filled 2015-10-07: qty 10

## 2015-10-07 MED ORDER — BUPIVACAINE-EPINEPHRINE (PF) 0.5% -1:200000 IJ SOLN
INTRAMUSCULAR | Status: AC
  Filled 2015-10-07: qty 1.8

## 2015-10-07 NOTE — ED Provider Notes (Signed)
CSN: [id number]     Arrival date & time 10/07/15  1352 History   First MD Initiated Contact with Patient 10/07/15 1424     Chief Complaint  Patient presents with  . Dental Pain     (Consider location/radiation/quality/duration/timing/severity/associated sxs/prior Treatment) HPI   Pt is a 35 yo male who presents to the ED with complaint of dental pain, onset 3 days. Pt reports he was seen in the ED 2 days ago, given dental block and d/c home with NSAIDs and penicillin. He states he has been taking PNC and ibuprofen with mild intermittent relief. He notes he has a scheduled appointment with his dentist on 4/19. Denies fever, chills, facial/neck swelling, drainage, dysphagia, dyspnea.   Past Medical History  Diagnosis Date  . Atrial fibrillation ([place])     Appears associated with alcohol abuse and withdrawal  . Pancreatic pseudocyst/cyst     Spring of 2015 per patient- states at [place]   . Depression     Has a Social worker at [employer].  . Gout 2011-2012  . ETOH abuse 2013    Relates to separation/divorce    Past Surgical History  Procedure Laterality Date  . Colonoscopy with polypectomy  2016    [facility]--had rectal bleeding and one was precancerous   Family History  Problem Relation Age of Onset  . Breast cancer Mother 32  . Hyperlipidemia Mother   . Anxiety disorder Mother   . Alcohol abuse Father   . Heart disease Father 54    Triple bypass   . COPD Father     chronic smoker  . Alcohol abuse Brother   . Depression Brother    Social History  Substance Use Topics  . Smoking status: Current Every Day Smoker -- 0.50 packs/day for 4 years    Types: Cigarettes  . Smokeless tobacco: Never Used  . Alcohol Use: No     Comment: Quit 110 days ago with treatment at [place].    Review of Systems  Constitutional: Negative for fever.  HENT: Positive for dental problem. Negative for facial swelling and trouble swallowing.     Respiratory: Negative for shortness of breath.   All other systems reviewed and are negative.     Allergies  Review of patient's allergies indicates no known allergies.  Home Medications   Prior to Admission medications   Medication Sig Start Date End Date Taking? Authorizing Provider  acetaminophen (TYLENOL) 325 MG tablet Take 2 tablets (650 mg total) by mouth every 6 (six) hours as needed. 06/15/15   Courteney Lyn Mackuen, MD  clonazePAM (KLONOPIN) 1 MG tablet 1/2 tab by mouth twice daily as needed for anxiety 10/03/15   Mack Hook, MD  diltiazem (CARDIZEM CD) 120 MG 24 hr capsule 1 tab once daily by mouth 08/06/15   Mack Hook, MD  gabapentin (NEURONTIN) 300 MG capsule Take 1 capsule (300 mg total) by mouth 3 (three) times daily. 08/06/15   Mack Hook, MD  HYDROcodone-acetaminophen (NORCO/VICODIN) 5-325 MG tablet Take 2 tablets by mouth every 4 (four) hours as needed. 10/05/15   Nona Dell, PA-C  penicillin v potassium (VEETID) 500 MG tablet Take 1 tablet (500 mg total) by mouth 2 (two) times daily. 10/05/15 10/12/15  Nona Dell, PA-C  QUEtiapine Fumarate (SEROQUEL XR) 150 MG 24 hr tablet Take 200 mg by mouth at bedtime.     Historical Provider, MD  sertraline (ZOLOFT) 100 MG tablet Take 100 mg  by mouth daily.    Historical Provider, MD  traMADol (ULTRAM) 50 MG tablet Take 1 tablet (50 mg total) by mouth every 6 (six) hours as needed for severe pain. Patient not taking: Reported on 09/06/2015 08/30/15   Dalia Heading, PA-C   BP 130/84 mmHg  Pulse 74  Temp(Src) 98.3 F (36.8 C) (Oral)  Resp 18  Ht 5\' 11"  (1.803 m)  Wt 95.255 kg  BMI 29.30 kg/m2  SpO2 100% Physical Exam  Constitutional: He is oriented to person, place, and time. He appears well-developed and well-nourished. No distress.  HENT:  Head: Normocephalic and atraumatic.  Mouth/Throat: Uvula is midline, oropharynx is clear and moist and mucous membranes are normal. No oral  lesions. No trismus in the jaw. Abnormal dentition. Dental caries present. No dental abscesses or uvula swelling. No oropharyngeal exudate, posterior oropharyngeal edema, posterior oropharyngeal erythema or tonsillar abscesses.    No facial/neck swelling  Eyes: Conjunctivae and EOM are normal. Right eye exhibits no discharge. Left eye exhibits no discharge. No scleral icterus.  Neck: Normal range of motion. Neck supple.  Cardiovascular: Normal rate.   Pulmonary/Chest: Effort normal. No respiratory distress.  Lymphadenopathy:    He has no cervical adenopathy.  Neurological: He is alert and oriented to person, place, and time.  Skin: Skin is warm and dry. He is not diaphoretic.  Nursing note and vitals reviewed.   ED Course  .Nerve Block Date/Time: 10/07/2015 3:51 PM Performed by: Nona Dell Authorized by: Nona Dell Consent: Verbal consent obtained. Risks and benefits: risks, benefits and alternatives were discussed Consent given by: patient Patient understanding: patient states understanding of the procedure being performed Patient identity confirmed: verbally with patient Indications: pain relief Body area: face/mouth Nerve: inferior alveolar Laterality: left Patient position: sitting Needle gauge: 45 G Location technique: anatomical landmarks Local anesthetic: bupivacaine 0.5% with epinephrine Anesthetic total: 1.8 ml Outcome: pain improved Patient tolerance: Patient tolerated the procedure well with no immediate complications   (including critical care time) Labs Review Labs Reviewed - No data to display  Imaging Review No results found. I have personally reviewed and evaluated these images and lab results as part of my medical decision-making.   EKG Interpretation None      MDM   Final diagnoses:  Dental implant pain, initial encounter    Patient with toothache. Pt was seen by myself for same complaint 2 days ago, pt was given  dental block and d/c home with penicillin and ibuprofen. Pt endorses taking the ibuprofen and penicillin. No gross abscess, swelling and erythema appear to have improved since initial exam.  Exam unconcerning for Ludwig's angina or spread of infection.  Will treat with pain medicine.  Urged patient to follow-up with dental clinic tomorrow morning. Advised pt to continue taking his penicillin as prescribed.       Chesley Noon [place], [place] 10/08/15 [place], MD 10/08/15 1524

## 2015-10-07 NOTE — Discharge Instructions (Signed)
I recommend continuing to take 800 mg ibuprofen 3 times daily. Eat prior to taking her ibuprofen to prevent gastrointestinal side effects. I also recommend applying ice to affected area for 15-20 minutes 3-4 times daily to help with pain and swelling. I recommend calling your dentist office tomorrow morning to schedule a sooner follow-up appointment. Return to the emergency department if symptoms worsen or new onset of fever, facial/neck swelling, drainage, difficulty breathing, unable to swallow.

## 2015-10-07 NOTE — ED Notes (Signed)
Pt left prior to receiving discharge instructions and paperwork.

## 2015-10-07 NOTE — ED Notes (Signed)
Pt in c/o worse dental pain after being seen for broken tooth x 2 days ago. Was given abx and pain meds at that time.

## 2015-10-08 NOTE — Telephone Encounter (Signed)
Patient came in at 4:50pm to pick up Rx

## 2015-10-12 ENCOUNTER — Encounter (HOSPITAL_COMMUNITY): Payer: Self-pay | Admitting: Emergency Medicine

## 2015-10-12 ENCOUNTER — Emergency Department (HOSPITAL_COMMUNITY)
Admission: EM | Admit: 2015-10-12 | Discharge: 2015-10-12 | Disposition: A | Discharge status: Home / Self Care | Payer: Self-pay | Attending: Emergency Medicine | Admitting: Emergency Medicine

## 2015-10-12 DIAGNOSIS — F419 Anxiety disorder, unspecified: Secondary | ICD-10-CM | POA: Insufficient documentation

## 2015-10-12 DIAGNOSIS — F17210 Nicotine dependence, cigarettes, uncomplicated: Secondary | ICD-10-CM | POA: Insufficient documentation

## 2015-10-12 DIAGNOSIS — F329 Major depressive disorder, single episode, unspecified: Secondary | ICD-10-CM | POA: Insufficient documentation

## 2015-10-12 DIAGNOSIS — I4891 Unspecified atrial fibrillation: Secondary | ICD-10-CM | POA: Insufficient documentation

## 2015-10-12 DIAGNOSIS — K0889 Other specified disorders of teeth and supporting structures: Secondary | ICD-10-CM | POA: Insufficient documentation

## 2015-10-12 DIAGNOSIS — Z79899 Other long term (current) drug therapy: Secondary | ICD-10-CM | POA: Insufficient documentation

## 2015-10-12 DIAGNOSIS — G8918 Other acute postprocedural pain: Secondary | ICD-10-CM | POA: Insufficient documentation

## 2015-10-12 DIAGNOSIS — Z8739 Personal history of other diseases of the musculoskeletal system and connective tissue: Secondary | ICD-10-CM | POA: Insufficient documentation

## 2015-10-12 DIAGNOSIS — K08409 Partial loss of teeth, unspecified cause, unspecified class: Secondary | ICD-10-CM | POA: Insufficient documentation

## 2015-10-12 HISTORY — DX: Other specified diseases of pancreas: K86.89

## 2015-10-12 MED ORDER — TRAMADOL HCL 50 MG PO TABS: 50.0000 mg | ORAL_TABLET | Freq: Four times a day (QID) | ORAL | Status: DC | PRN

## 2015-10-12 NOTE — ED Notes (Signed)
Pt. Reports feeling lethargic and states "probably due to reduced caloric intake because of liquid diet". Pt. States he has been drinking Propel water, and soup as intake.   Pt. Also voices concern over fluid-like sac on the posterior head unrelated to visit today.

## 2015-10-12 NOTE — ED Notes (Signed)
PA-C at bedside 

## 2015-10-12 NOTE — ED Provider Notes (Signed)
CSN: [id number]     Arrival date & time 10/12/15  1551 History  By signing my name below, I, Randa Evens, attest that this documentation has been prepared under the direction and in the presence of Newton, NP. Electronically Signed: Randa Evens, ED Scribe. 10/12/2015. 4:56 PM.    Chief Complaint  Patient presents with  . Dental Pain   Patient is a 35 y.o. male presenting with tooth pain. The history is provided by the patient. No language interpreter was used.  Dental Pain Location:  Lower Lower teeth location:  30/RL 1st molar and 31/RL 2nd molar Quality:  Throbbing Severity:  Mild Duration:  2 weeks Progression:  Waxing and waning Context: recent dental surgery   Relieved by:  Acetaminophen and topical anesthetic gel Associated symptoms: no facial swelling and no fever   Risk factors: smoking    HPI Comments: Alejandro Flores is a 35 y.o. male who presents to the Emergency Department complaining of right sided lower dental pain onset 2 month prior. He states that he has recently had 3 teeth extracted 1 week prior. Pt states that he continued to have stabbing pain afterwards. Pt states that he woke up this morning with blood on his pillow. He states that he was having relief with hydrocodone, ibuprofen and tylenol. Pt reports that he has also been applying a topical numbing application. Pt states that he has been compliant with antibiotics. Pt states that the pain is worse when eating solids or liquids. Pt denies fever or drainage.   Past Medical History  Diagnosis Date  . Atrial fibrillation ([place])     Appears associated with alcohol abuse and withdrawal  . Pancreatic pseudocyst/cyst     Spring of 2015 per patient- states at [place]   . Depression     Has a Social worker at [employer].  . Gout 2011-2012  . ETOH abuse 2013    Relates to separation/divorce   . 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  Colonoscopy with polypectomy  2016    [facility]--had rectal bleeding and one was precancerous   Family History  Problem Relation Age of Onset  . Breast cancer Mother 33  . Hyperlipidemia Mother   . Anxiety disorder Mother   . Alcohol abuse Father   . Heart disease Father 69    Triple bypass   . COPD Father     chronic smoker  . Alcohol abuse Brother   . Depression Brother    Social History  Substance Use Topics  . Smoking status: Current Every Day Smoker -- 0.50 packs/day for 4 years    Types: Cigarettes  . Smokeless tobacco: Never Used  . Alcohol Use: No     Comment: Quit 110 days ago with treatment at [facility].    Review of Systems  Constitutional: Negative for fever.  HENT: Positive for dental problem. Negative for facial swelling.   All other systems reviewed and are negative.     Allergies  Review of patient's allergies indicates no known allergies.  Home Medications   Prior to Admission medications   Medication Sig Start Date End Date Taking? Authorizing Provider  acetaminophen (TYLENOL) 325 MG tablet Take 2 tablets (650 mg total) by mouth every 6 (six) hours as needed. 06/15/15   Courteney Lyn Mackuen, MD  clonazePAM (KLONOPIN) 1 MG tablet 1/2 tab by mouth twice daily as needed for anxiety  10/03/15   Mack Hook, MD  diltiazem (CARDIZEM CD) 120 MG 24 hr capsule 1 tab once daily by mouth 08/06/15   Mack Hook, MD  gabapentin (NEURONTIN) 300 MG capsule Take 1 capsule (300 mg total) by mouth 3 (three) times daily. 08/06/15   Mack Hook, MD  HYDROcodone-acetaminophen (NORCO/VICODIN) 5-325 MG tablet Take 2 tablets by mouth every 4 (four) hours as needed. 10/05/15   Nona Dell, PA-C  QUEtiapine Fumarate (SEROQUEL XR) 150 MG 24 hr tablet Take 200 mg by mouth at bedtime.     Historical Provider, MD  sertraline (ZOLOFT) 100 MG tablet Take 100 mg by mouth daily.    Historical Provider, MD  traMADol (ULTRAM)  50 MG tablet Take 1 tablet (50 mg total) by mouth every 6 (six) hours as needed. 10/12/15   Hope Bunnie Pion, NP   BP 114/66 mmHg  Pulse 81  Temp(Src) 98.3 F (36.8 C) (Oral)  Resp 16  SpO2 97%   Physical Exam  Constitutional: He is oriented to person, place, and time. He appears well-developed and well-nourished. No distress.  HENT:  Head: Normocephalic and atraumatic.  Mouth/Throat: Uvula is midline, oropharynx is clear and moist and mucous membranes are normal.    Right lower gum area healing well without drainage or signs of infection. Sutures visualized.   Eyes: Conjunctivae and EOM are normal.  Neck: Neck supple. No tracheal deviation present.  Cardiovascular: Normal rate and regular rhythm.   Pulmonary/Chest: Effort normal. No respiratory distress.  Musculoskeletal: Normal range of motion.  Neurological: He is alert and oriented to person, place, and time.  Skin: Skin is warm and dry.  Psychiatric: He has a normal mood and affect. His behavior is normal.  Nursing note and vitals reviewed.   ED Course  Procedures (including critical care time) DIAGNOSTIC STUDIES: Oxygen Saturation is 97% on RA, normal by my interpretation.    COORDINATION OF CARE: 4:56 PM-Discussed treatment plan with pt at bedside and pt agreed to plan.  5:01 PM- Will give pt a few tramadol until he can get back in contact with his dentist.     MDM  35 y.o. male with pain following dental surgery stable for d/c without signs of infection. Patient does not appear dehydrated. Will give patient a few Tramadol until he can get back with the dentist. Discussed with the patient and all questioned fully answered.    Final diagnoses:  Pain following oral surgery   I personally performed the services described in this documentation, which was scribed in my presence. The recorded information has been reviewed and is accurate.      [address] [place], [place] 10/13/15 1413  Veryl Speak, MD 10/14/15 [phone]

## 2015-10-12 NOTE — ED Notes (Signed)
Pt. States that he had tooth extraction a week ago. 3 teeth were removed due to decay. Pt. Reports waking to a "moderate" amount of blood on his pillow at 0400 this morning. Pt. States that pain has been increasing throughout the day. Pt. Reports pain 8/10 now. Pt. Has been taking 800 mg. Advil q 8 hrs. Pt. Reports sore nodule on inside jawline on the rt. Side.

## 2015-10-12 NOTE — ED Notes (Signed)
Pt. Verbalizes understanding of d/c instructions, prescriptions, and follow-up care. Pt. displays no s/s of distress at this time. Pt. Verbalizes no concerns. VS stable. Pt. Ambulatory out of the unit with steady gait.

## 2015-10-15 ENCOUNTER — Encounter: Payer: Self-pay | Admitting: Internal Medicine

## 2015-10-15 ENCOUNTER — Ambulatory Visit (INDEPENDENT_AMBULATORY_CARE_PROVIDER_SITE_OTHER): Payer: Self-pay | Admitting: Internal Medicine

## 2015-10-15 VITALS — BP 108/74 | HR 72 | Resp 18 | Ht 69.0 in | Wt 206.0 lb

## 2015-10-15 DIAGNOSIS — Z720 Tobacco use: Secondary | ICD-10-CM

## 2015-10-15 DIAGNOSIS — F3170 Bipolar disorder, currently in remission, most recent episode unspecified: Secondary | ICD-10-CM

## 2015-10-15 DIAGNOSIS — M273 Alveolitis of jaws: Secondary | ICD-10-CM

## 2015-10-15 DIAGNOSIS — I480 Paroxysmal atrial fibrillation: Secondary | ICD-10-CM

## 2015-10-15 MED ORDER — DICLOFENAC SODIUM 75 MG PO TBEC: 75.0000 mg | DELAYED_RELEASE_TABLET | Freq: Two times a day (BID) | ORAL | Status: DC

## 2015-10-15 NOTE — Patient Instructions (Addendum)
Drop by [facility] and see if can get some more analgesic packing/paste Ask about Clove oil   No smoking or use of vape or straw.  No oral nicotine.

## 2015-10-15 NOTE — Progress Notes (Signed)
   Subjective:    Patient ID: Alejandro Flores, male    DOB: [birth year], 35 y.o.   MRN: [id number]  HPI   1.  Had 3 teeth extracted and on the 21st, was gradually improving, then developed bleeding and increased pain on the 21st, so went to ED.  Is still taking Penicillin.  Was given Hydrocodone.  Has 3 more teeth to extract and get some more fillings.  Has used straw, was smoking before and also using Vape, which requires significant suction.  2.  Mental Health:  Did receive records from [facility], some diagnoses are redacted, not obvious why, but does have diagnosis as suspected of Unspecified Bipolar Disorder and Anxiety disorder.  Prescribed Sertraline and Seroquel.  3.  Feels fatigued.  Not eating due to dental pain.  Has tried Ibuprofen and the ibuprofen for headaches not helping.  Has tried Naproxen without much improvement.    4.  Tobacco Abuse: STates using vape without nicotine.  5.  Paroxysmal ATrial fibrillation:  No definite episodes, though states with pains, has had a couple of seconds of feeling like it would go into afib.   Current outpatient prescriptions:  .  acetaminophen (TYLENOL) 325 MG tablet, Take 2 tablets (650 mg total) by mouth every 6 (six) hours as needed., Disp: 14 tablet, Rfl: 0 .  clonazePAM (KLONOPIN) 1 MG tablet, 1/2 tab by mouth twice daily as needed for anxiety, Disp: 30 tablet, Rfl: 0 .  diltiazem (CARDIZEM CD) 120 MG 24 hr capsule, 1 tab once daily by mouth, Disp: 30 capsule, Rfl: 11 .  gabapentin (NEURONTIN) 300 MG capsule, Take 1 capsule (300 mg total) by mouth 3 (three) times daily., Disp: 90 capsule, Rfl: 3 .  QUEtiapine Fumarate (SEROQUEL XR) 150 MG 24 hr tablet, Take 200 mg by mouth at bedtime. , Disp: , Rfl:  .  sertraline (ZOLOFT) 100 MG tablet, Take 100 mg by mouth daily., Disp: , Rfl:    No Known Allergies  Review of Systems     Objective:   Physical Exam  Smells of cigarette smoke--states was out in smoking area with coworkers NAD HEENT:  PERRL,  EOMI, throat without injection.   Right lower jaw with openings where molars were--grayish substance in openings.  Gingiva without erythema.  No swelling of face or jaw. Neck:  Supple, no adenopathy Chest:  CTA CV:  RRR without murmur or rub, radial pulses normal and equal      Assessment & Plan:  1.  Dry Socket:  Call into [facility].  Asked patient to stop by the dental clinic and see if could get more of the analgesic packs he had after his extractions.  He is to stop nicotine via vape or cigarette or gum--to put the 21 mg patch on daily.  No straws.  To continue gentle rinsing  2.  Tobacco Abuse:  Not clear patient is really ready to quit.  Encouraged to move away from smoking behaviors, including vape.  Would use the nicotine patch and get rid of all cigarettes, vape stuff, lighters, ash trays.  3.  Paroxysmal A Fib:  Sounds stable.  Continue Diltiazem.  Stay away from alcohol.  4.  Bipolar disorder:  Encouraged to get back on mood stabilizer to avoid manic episode  4.

## 2015-10-23 ENCOUNTER — Emergency Department (HOSPITAL_COMMUNITY)
Admission: EM | Admit: 2015-10-23 | Discharge: 2015-10-23 | Disposition: A | Discharge status: Home / Self Care | Payer: Self-pay | Attending: Emergency Medicine | Admitting: Emergency Medicine

## 2015-10-23 ENCOUNTER — Encounter (HOSPITAL_COMMUNITY): Payer: Self-pay | Admitting: Emergency Medicine

## 2015-10-23 DIAGNOSIS — K0889 Other specified disorders of teeth and supporting structures: Secondary | ICD-10-CM | POA: Insufficient documentation

## 2015-10-23 DIAGNOSIS — F17210 Nicotine dependence, cigarettes, uncomplicated: Secondary | ICD-10-CM | POA: Insufficient documentation

## 2015-10-23 NOTE — ED Notes (Signed)
Last attempt made to call patient from lobby, no answer

## 2015-10-23 NOTE — ED Notes (Signed)
Called patient for room with no answer, will attempt one more time before removing from trackboard.

## 2015-10-23 NOTE — ED Notes (Signed)
Tooth pulled last Tuesday. Says Sunday the pain came back. Says he's worried they missed a sliver of tooth because he can feel the gum and a small piece of tooth at the extraction site. C/o dental pain

## 2015-10-23 NOTE — ED Notes (Addendum)
Patient was triaged and placed back in lobby to wait for room assignment,when room was open patient was called with no answer. Lobby, restrooms,parking area checked with no answer.

## 2015-10-26 ENCOUNTER — Encounter (HOSPITAL_COMMUNITY): Payer: Self-pay | Admitting: Emergency Medicine

## 2015-10-26 ENCOUNTER — Ambulatory Visit (HOSPITAL_COMMUNITY)
Admission: EM | Admit: 2015-10-26 | Discharge: 2015-10-26 | Disposition: A | Discharge status: Home / Self Care | Payer: Self-pay | Attending: Emergency Medicine | Admitting: Emergency Medicine

## 2015-10-26 DIAGNOSIS — K0889 Other specified disorders of teeth and supporting structures: Secondary | ICD-10-CM

## 2015-10-26 MED ORDER — AMOXICILLIN 500 MG PO CAPS: 1000.0000 mg | ORAL_CAPSULE | Freq: Two times a day (BID) | ORAL | Status: DC

## 2015-10-26 MED ORDER — TRAMADOL HCL 50 MG PO TABS: 50.0000 mg | ORAL_TABLET | Freq: Four times a day (QID) | ORAL | Status: DC | PRN

## 2015-10-26 NOTE — ED Provider Notes (Signed)
CSN: [id number]     Arrival date & time 10/26/15  1410 History   First MD Initiated Contact with Patient 10/26/15 1548     Chief Complaint  Patient presents with  . Dental Pain   (Consider location/radiation/quality/duration/timing/severity/associated sxs/prior Treatment) HPI Comments: 35 year old male complaining of dental pain. He states he had molars in the right lower jaw extracted approximately 10 days ago. He states he is feeling a "shard" of a tooth in the right third molar space. He is concerned about infection. He is also concerned about pain. He has been to the emergency department and apparent other clinics 5 times in the past 3 weeks for tooth pain. Today will make the sixth. He states he is taking ibuprofen 800 mg every 8 hours with minimal effect.  He is also complaining of malaise.  Patient is a 35 y.o. male presenting with tooth pain.  Dental Pain Associated symptoms: no congestion and no fever     Past Medical History  Diagnosis Date  . Atrial fibrillation ([place])     Appears associated with alcohol abuse and withdrawal  . Pancreatic pseudocyst/cyst     Spring of 2015 per patient- states at [place]   . Depression     Has a Social worker at [employer].  . Gout 2011-2012  . ETOH abuse 2013    Relates to separation/divorce   . 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 Colonoscopy with polypectomy  2016    [facility]--had rectal bleeding and one was precancerous   Family History  Problem Relation Age of Onset  . Breast cancer Mother 19  . Hyperlipidemia Mother   . Anxiety disorder Mother   . Alcohol abuse Father   . Heart disease Father 80    Triple bypass   . COPD Father     chronic smoker  . Alcohol abuse Brother   . Depression Brother    Social History  Substance Use Topics  . Smoking status: Current Every Day Smoker -- 0.50 packs/day for 4 years    Types: Cigarettes  .  Smokeless tobacco: Never Used  . Alcohol Use: No     Comment: Close to 200 days ago with treatment at [facility] and alcohol free.  Going 2-3 times to AA weekly    Review of Systems  Constitutional: Negative for fever, activity change and fatigue.  HENT: Positive for dental problem. Negative for congestion, ear pain and postnasal drip.   Eyes: Negative.   Gastrointestinal: Negative.   Skin: Negative.   Neurological: Negative.     Allergies  Review of patient's allergies indicates no known allergies.  Home Medications   Prior to Admission medications   Medication Sig Start Date End Date Taking? Authorizing Provider  diltiazem (CARDIZEM CD) 120 MG 24 hr capsule 1 tab once daily by mouth 08/06/15  Yes Mack Hook, MD  sertraline (ZOLOFT) 100 MG tablet Take 100 mg by mouth daily.   Yes Historical Provider, MD  acetaminophen (TYLENOL) 325 MG tablet Take 2 tablets (650 mg total) by mouth every 6 (six) hours as needed. Patient not taking: Reported on 10/15/2015 06/15/15   Courteney Lyn Mackuen, MD  amoxicillin (AMOXIL) 500 MG capsule Take 2 capsules (1,000 mg total) by mouth 2 (two) times daily. 10/26/15   Janne Napoleon, NP  clonazePAM Bobbye [place]) 1 MG tablet 1/2 tab by mouth twice daily as needed for anxiety 10/03/15  Mack Hook, MD  diclofenac (VOLTAREN) 75 MG EC tablet Take 1 tablet (75 mg total) by mouth 2 (two) times daily. 10/15/15   Mack Hook, MD  gabapentin (NEURONTIN) 300 MG capsule Take 1 capsule (300 mg total) by mouth 3 (three) times daily. 08/06/15   Mack Hook, MD  QUEtiapine Fumarate (SEROQUEL XR) 150 MG 24 hr tablet Take 200 mg by mouth at bedtime. Reported on 10/15/2015    Historical Provider, MD  traMADol (ULTRAM) 50 MG tablet Take 1 tablet (50 mg total) by mouth every 6 (six) hours as needed. 10/26/15   Janne Napoleon, NP   Meds Ordered and Administered this Visit  Medications - No data to display  BP 133/83 mmHg  Pulse 91   Temp(Src) 98.2 F (36.8 C) (Oral)  Resp 16  SpO2 100% No data found.   Physical Exam  Constitutional: He is oriented to person, place, and time. He appears well-developed and well-nourished. No distress.  HENT:  Mouth/Throat: No oropharyngeal exudate.  Dental exam vertically to the right lower jaw reveals the absence of first and second molars. The OpSite and adjacent gingival tissue are pink. There is no erythema, swelling or signs of abscess. No facial swelling. No swelling of the buccal mucosa. No dental tenderness.  Eyes: Conjunctivae and EOM are normal.  Neck: Normal range of motion. Neck supple.  Cardiovascular: Normal rate.   Pulmonary/Chest: Effort normal.  Neurological: He is alert and oriented to person, place, and time.  Skin: Skin is warm and dry.  Nursing note and vitals reviewed.   ED Course  Procedures (including critical care time)  Labs Review Labs Reviewed - No data to display  Imaging Review No results found.   Visual Acuity Review  Right Eye Distance:   Left Eye Distance:   Bilateral Distance:    Right Eye Near:   Left Eye Near:    Bilateral Near:         MDM   1. Pain, dental    Meds ordered this encounter  Medications  . traMADol (ULTRAM) 50 MG tablet    Sig: Take 1 tablet (50 mg total) by mouth every 6 (six) hours as needed.    Dispense:  15 tablet    Refill:  0    Order Specific Question:  Supervising Provider    Answer:  Melony Overly [id number]  . amoxicillin (AMOXIL) 500 MG capsule    Sig: Take 2 capsules (1,000 mg total) by mouth 2 (two) times daily.    Dispense:  30 capsule    Refill:  0    Order Specific Question:  Supervising Provider    Answer:  Melony Overly [id number]   The patient has a history of alcoholism, smoker, anxiety, depression, pancreatic disease. I spoke to the patient in regards to the number of controlled medications that he has had field in the past 3 weeks and into January of this year. He was advised that he  would be best if possible to receive these medications from one physician or dentist. No accusations were made. The patient is to see his dentist since possible. He has an appointment on the 10th of this month. Verbal and written discharge instructions have been reviewed and given to the patient  by the provider to include medications and other care measures.     Janne Napoleon, NP 10/26/15 [place], NP 10/26/15 [phone]

## 2015-10-26 NOTE — ED Notes (Signed)
C/o right lower dental pain onset 4/30... Reports he had some teeth pulled recently  Has appt w/dentist this week coming up A&O x4... No acute distress.

## 2015-11-01 ENCOUNTER — Telehealth: Payer: Self-pay | Admitting: Internal Medicine

## 2015-11-01 NOTE — Telephone Encounter (Signed)
Patient called requesting refill for clonazePAM (KLONOPIN) 1 MG tablet . Patient states he is out of pills. Patient aware of the 48 hours notice for Rx refills. FU appointment on 11/05/15 at 8:30 A.M.

## 2015-11-05 ENCOUNTER — Encounter: Payer: Self-pay | Admitting: Internal Medicine

## 2015-11-05 ENCOUNTER — Ambulatory Visit (INDEPENDENT_AMBULATORY_CARE_PROVIDER_SITE_OTHER): Payer: Self-pay | Admitting: Internal Medicine

## 2015-11-05 VITALS — BP 126/62 | HR 52 | Resp 12 | Ht 69.0 in | Wt 207.0 lb

## 2015-11-05 DIAGNOSIS — R3589 Other polyuria: Secondary | ICD-10-CM

## 2015-11-05 DIAGNOSIS — R358 Other polyuria: Secondary | ICD-10-CM

## 2015-11-05 DIAGNOSIS — K029 Dental caries, unspecified: Secondary | ICD-10-CM

## 2015-11-05 DIAGNOSIS — L989 Disorder of the skin and subcutaneous tissue, unspecified: Secondary | ICD-10-CM

## 2015-11-05 DIAGNOSIS — E785 Hyperlipidemia, unspecified: Secondary | ICD-10-CM

## 2015-11-05 DIAGNOSIS — F410 Panic disorder [episodic paroxysmal anxiety] without agoraphobia: Secondary | ICD-10-CM

## 2015-11-05 MED ORDER — HYDROCODONE-ACETAMINOPHEN 5-325 MG PO TABS: 1.0000 | ORAL_TABLET | Freq: Four times a day (QID) | ORAL | Status: DC | PRN

## 2015-11-05 MED ORDER — CLONAZEPAM 1 MG PO TABS: ORAL_TABLET | ORAL | Status: DC
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
## 2015-11-06 LAB — LIPID PANEL W/O CHOL/HDL RATIO
Cholesterol, Total: 251 mg/dL — ABNORMAL HIGH (ref 100–199)
HDL: 26 mg/dL — AB (ref >39)
TRIGLYCERIDES: 412 mg/dL — AB (ref 0–149)

## 2015-11-06 LAB — BASIC METABOLIC PANEL
BUN/Creatinine Ratio: 5 — ABNORMAL LOW (ref 9–20)
BUN: 4 mg/dL — ABNORMAL LOW (ref 6–20)
CALCIUM: 9.3 mg/dL (ref 8.7–10.2)
CHLORIDE: 105 mmol/L (ref 96–106)
CO2: 24 mmol/L (ref 18–29)
Creatinine, Ser: 0.79 mg/dL (ref 0.76–1.27)
GFR calc Af Amer: 134 mL/min/{1.73_m2} (ref >59)
GFR calc non Af Amer: 116 mL/min/{1.73_m2} (ref >59)
Glucose: 85 mg/dL (ref 65–99)
POTASSIUM: 5.1 mmol/L (ref 3.5–5.2)
SODIUM: 145 mmol/L — AB (ref 134–144)

## 2015-11-08 NOTE — Telephone Encounter (Signed)
Patient came in for a follow up on 11/05/15 and was given Rx for medication.

## 2015-11-28 ENCOUNTER — Telehealth: Payer: Self-pay | Admitting: Internal Medicine

## 2015-11-28 DIAGNOSIS — F410 Panic disorder [episodic paroxysmal anxiety] without agoraphobia: Secondary | ICD-10-CM

## 2015-11-28 NOTE — Telephone Encounter (Signed)
Patient calling for refill of Rx clonazePAM Bobbye Charleston)  Would like Rx called into [facility].

## 2015-11-30 ENCOUNTER — Telehealth: Payer: Self-pay

## 2015-11-30 MED ORDER — CLONAZEPAM 1 MG PO TABS: ORAL_TABLET | ORAL | Status: DC

## 2015-11-30 NOTE — Telephone Encounter (Signed)
Patient called to say he has broken his tooth. He has appointment at [facility] on Wednesday but thinks he may need an ATB and/or pain medication before the dental appt. He is also concerned that the dental clinic will not be able to pull the tooth and he may have to see an oral surgeon. Please advise.

## 2015-11-30 NOTE — Telephone Encounter (Signed)
Patient informed Rx is ready for him to pick up at [facility].

## 2015-11-30 NOTE — Telephone Encounter (Signed)
Patient picked up Rx

## 2015-11-30 NOTE — Telephone Encounter (Signed)
He was here to pick up his prescription for Klonopin earlier today. He needs to be seen before I will prescribe anything for his teeth.

## 2015-12-03 ENCOUNTER — Encounter: Payer: Self-pay | Admitting: Internal Medicine

## 2015-12-03 ENCOUNTER — Ambulatory Visit (INDEPENDENT_AMBULATORY_CARE_PROVIDER_SITE_OTHER): Payer: Self-pay | Admitting: Internal Medicine

## 2015-12-03 VITALS — BP 138/78 | HR 84 | Temp 98.0°F | Resp 20 | Ht 69.0 in | Wt 214.0 lb

## 2015-12-03 DIAGNOSIS — K0889 Other specified disorders of teeth and supporting structures: Secondary | ICD-10-CM

## 2015-12-03 MED ORDER — PENICILLIN V POTASSIUM 500 MG PO TABS: 500.0000 mg | ORAL_TABLET | Freq: Four times a day (QID) | ORAL | Status: DC

## 2015-12-03 NOTE — Progress Notes (Signed)
   Subjective:    Patient ID: Alejandro Flores, male    DOB: [birth year], 35 y.o.   MRN: [id number]  HPI  1.  Left lower posterior molar:  Increased pain with breakage.  He feels he has redness and swelling of surrounding gum.  No definite fever, but awakened last night   He has an appt. With dental tomorrow, but has swelling and redness.   He also went to Dr. Randall Hiss at another dental clinic near [address] clinic for removal and making of bridges or dentures.  Not clear of the latter, which.  2.  Lesion at right occiput.  He was seen by [facility] plastic surgery and he will likely need general anesthesia.  They were debating cyst or lipoma, but felt it was too large to remove as an outpatient.   Current outpatient prescriptions:  .  acetaminophen (TYLENOL) 325 MG tablet, Take 2 tablets (650 mg total) by mouth every 6 (six) hours as needed., Disp: 14 tablet, Rfl: 0 .  clonazePAM (KLONOPIN) 1 MG tablet, 1/2 tab by mouth twice daily as needed for anxiety, Disp: 30 tablet, Rfl: 0 .  diclofenac (VOLTAREN) 75 MG EC tablet, Take 1 tablet (75 mg total) by mouth 2 (two) times daily., Disp: 60 tablet, Rfl: 0 .  diltiazem (CARDIZEM CD) 120 MG 24 hr capsule, 1 tab once daily by mouth, Disp: 30 capsule, Rfl: 11 .  gabapentin (NEURONTIN) 300 MG capsule, Take 300 mg by mouth 3 (three) times daily., Disp: , Rfl:  .  QUEtiapine Fumarate (SEROQUEL XR) 150 MG 24 hr tablet, Take 200 mg by mouth at bedtime. Reported on 10/15/2015, Disp: , Rfl:  .  sertraline (ZOLOFT) 100 MG tablet, Take 100 mg by mouth daily., Disp: , Rfl:  .  HYDROcodone-acetaminophen (NORCO/VICODIN) 5-325 MG tablet, Take 1 tablet by mouth every 6 (six) hours as needed for severe pain. (Patient not taking: Reported on 12/03/2015), Disp: 15 tablet, Rfl: 0     Allergies  Allergen Reactions  . Morphine Itching and Rash    "hives"        Review of Systems     Objective:   Physical Exam NAD HEENT:  PERRL, EOMI, gingiva well healed left lower molar  extraction area.   Right posterior 3 molars with broken teeth to gum line, black discoloration.  Mild redness and swelling of gingiva posterior to last molar--tender.  No swelling of buccal mucosa or cheek. Neck:  Supple, No adenopathy or anterior cervical node tenderness.      Assessment & Plan:  1.  Dental pain with likely abscess.  Ibuprofen for pain.  Penicillin 500 mg 4 times daily for 7 days.  Possible excision tomorrow.  2.  Right occipital scalp lesion:  As per [facility] plastic surgery.  Unable to find skull xray results in care everywhere.

## 2015-12-03 NOTE — Patient Instructions (Signed)
Ibuprofen 400-800 mg every 6 hours as needed for pain--always with food.

## 2015-12-10 ENCOUNTER — Telehealth: Payer: Self-pay | Admitting: Internal Medicine

## 2015-12-10 NOTE — Telephone Encounter (Signed)
Patient would like Rx HYDROcodone refilled.  When they pulled out tooth, they discovered mass of infection and patient was given Rx for infection, and patient is using tylenol and ora gel.  He is having headache and unable to eat.  Patient needs Rx as soon as possible.  The toothpain is really bad and is accompanied with head ache.

## 2015-12-11 NOTE — Telephone Encounter (Signed)
Patient informed to call the dentist for pain medication.

## 2015-12-31 ENCOUNTER — Other Ambulatory Visit: Payer: Self-pay | Admitting: Internal Medicine

## 2015-12-31 DIAGNOSIS — F410 Panic disorder [episodic paroxysmal anxiety] without agoraphobia: Secondary | ICD-10-CM

## 2015-12-31 NOTE — Telephone Encounter (Signed)
Patient calling for Rx refill of clonazePAM 1 mg. Tab.  Patient is aware that he will also have to pick up the Rx.  Patient can be reached at [phone].

## 2015-12-31 NOTE — Telephone Encounter (Signed)
To Dr. Mulberry for approval 

## 2016-01-01 MED ORDER — CLONAZEPAM 1 MG PO TABS: ORAL_TABLET | ORAL | Status: DC

## 2016-01-01 NOTE — Telephone Encounter (Signed)
To Dr. Mulberry for approval 

## 2016-01-02 NOTE — Telephone Encounter (Signed)
Patient informed that rx is at the front desk for pick up.

## 2016-01-02 NOTE — Telephone Encounter (Signed)
Previously printed refill.

## 2016-01-02 NOTE — Telephone Encounter (Signed)
This was filled already--and am finding this in my box.  Did he pick up?

## 2016-01-03 DIAGNOSIS — E785 Hyperlipidemia, unspecified: Secondary | ICD-10-CM | POA: Insufficient documentation

## 2016-01-03 DIAGNOSIS — I10 Essential (primary) hypertension: Secondary | ICD-10-CM | POA: Insufficient documentation

## 2016-01-03 DIAGNOSIS — F952 Tourette's disorder: Secondary | ICD-10-CM | POA: Insufficient documentation

## 2016-01-03 NOTE — Telephone Encounter (Signed)
Patient picked up Rx today

## 2016-01-08 ENCOUNTER — Encounter: Payer: Self-pay | Admitting: Internal Medicine

## 2016-01-08 ENCOUNTER — Ambulatory Visit (INDEPENDENT_AMBULATORY_CARE_PROVIDER_SITE_OTHER): Payer: Self-pay | Admitting: Internal Medicine

## 2016-01-08 VITALS — BP 124/82 | HR 80 | Temp 98.1°F | Resp 18 | Ht 69.0 in | Wt 222.0 lb

## 2016-01-08 DIAGNOSIS — K0889 Other specified disorders of teeth and supporting structures: Secondary | ICD-10-CM

## 2016-01-08 MED ORDER — PENICILLIN V POTASSIUM 500 MG PO TABS: 500.0000 mg | ORAL_TABLET | Freq: Four times a day (QID) | ORAL | Status: DC

## 2016-01-08 MED ORDER — HYDROCODONE-ACETAMINOPHEN 5-325 MG PO TABS: 1.0000 | ORAL_TABLET | Freq: Four times a day (QID) | ORAL | Status: DC | PRN

## 2016-01-08 NOTE — Progress Notes (Signed)
   Subjective:    Patient ID: Alejandro Flores, male    DOB: Dec 26, 1980, 35 y.o.   MRN: [id number]  HPI   Bit down on something crunchy today at work and right lateral incisor broke off.  Has pain.  Cannot get into dental appt. Until the 26th.   He is to have surgery of what is a lipoma on his right occiput.  May be involving his occipital nerve.  Discussed he would need to only get his pain meds through the plastic surgeon there.  He states he has already shared his history of drug abuse with them.   He is planning to see if he can get dental care at [facility] as he has [employer].      Current Meds  Medication Sig  . diclofenac (VOLTAREN) 75 MG EC tablet Take 1 tablet (75 mg total) by mouth 2 (two) times daily.  Marland Kitchen diltiazem (CARDIZEM CD) 120 MG 24 hr capsule 1 tab once daily by mouth  . gabapentin (NEURONTIN) 300 MG capsule Take 300 mg by mouth 3 (three) times daily.  . QUEtiapine Fumarate (SEROQUEL XR) 150 MG 24 hr tablet Take 200 mg by mouth at bedtime. Reported on 10/15/2015  . sertraline (ZOLOFT) 100 MG tablet Take 100 mg by mouth daily.  . [DISCONTINUED] clonazePAM (KLONOPIN) 1 MG tablet 1/2 tab by mouth twice daily as needed for anxiety   Allergies  Allergen Reactions  . Morphine Itching and Rash    "hives"    Review of Systems     Objective:   Physical Exam All incisors and premolars with deep decay at gumline.  Some swelling and mild erythema of gingiva in these areas.  Broken right tooth-lateral upper right incisor Neck:  Supple, no adenopathy       Assessment & Plan:  1.  Diffuse dental disease with newly broken tooth and some gingival involvement.  Pen V K 500 mg 4 times daily for 7 days.  Hydrocodone/Tylenol 5 mg/325 mg 1 tab every 6 hours as needed for pain.  #15 Pain contract in place.  When he does have surgery, only the surgeon will be allowed to prescribe controlled pain medication.  He understands

## 2016-02-01 ENCOUNTER — Telehealth: Payer: Self-pay

## 2016-02-01 DIAGNOSIS — F410 Panic disorder [episodic paroxysmal anxiety] without agoraphobia: Secondary | ICD-10-CM

## 2016-02-01 MED ORDER — CLONAZEPAM 1 MG PO TABS: ORAL_TABLET | ORAL | 0 refills | Status: DC

## 2016-02-01 NOTE — Telephone Encounter (Signed)
Patient called to request a klonopin refill.

## 2016-02-05 NOTE — Telephone Encounter (Signed)
Rx signed and given to Fraser Din to let patient know Klonopin Rx available for pick up

## 2016-02-07 ENCOUNTER — Ambulatory Visit (HOSPITAL_BASED_OUTPATIENT_CLINIC_OR_DEPARTMENT_OTHER): Payer: No Typology Code available for payment source | Admitting: Audiologist

## 2016-02-07 ENCOUNTER — Ambulatory Visit: Payer: No Typology Code available for payment source | Attending: Audiologist | Admitting: Otology & Neurotology

## 2016-02-07 VITALS — BP 141/84 | HR 67

## 2016-02-07 DIAGNOSIS — H9071 Mixed conductive and sensorineural hearing loss, unilateral, right ear, with unrestricted hearing on the contralateral side: Secondary | ICD-10-CM | POA: Insufficient documentation

## 2016-02-07 DIAGNOSIS — H905 Unspecified sensorineural hearing loss: Secondary | ICD-10-CM

## 2016-02-07 DIAGNOSIS — H9042 Sensorineural hearing loss, unilateral, left ear, with unrestricted hearing on the contralateral side: Secondary | ICD-10-CM | POA: Insufficient documentation

## 2016-02-07 DIAGNOSIS — H7191 Unspecified cholesteatoma, right ear: Secondary | ICD-10-CM | POA: Insufficient documentation

## 2016-02-07 DIAGNOSIS — H906 Mixed conductive and sensorineural hearing loss, bilateral: Secondary | ICD-10-CM | POA: Insufficient documentation

## 2016-02-07 DIAGNOSIS — H6522 Chronic serous otitis media, left ear: Secondary | ICD-10-CM | POA: Insufficient documentation

## 2016-02-07 DIAGNOSIS — H6983 Other specified disorders of Eustachian tube, bilateral: Secondary | ICD-10-CM | POA: Insufficient documentation

## 2016-02-07 NOTE — Progress Notes (Signed)
[name] referred by Dr. Leonides Cavelifford Hume MD PhD and seen in conjunction with otology visit.   Further details can be viewed in the note dictated by the attending otologist.  Subjective:  35 year old male with Hx of bilateral middle ear disease and PE tubes returns for follow up.      Objective:  Audiogram and tympanogram performed.  See scan of results under media tab in Epic.  Results for the right ear revealed normal hearing at 250 - 1000 Hz sloping to a mild to profound mixed hearing loss in the higher frequencies.  Results for the left ear revealed normal hearing at 250 - 3000 Hz sloping to a mild to moderate sensorineural hearing loss. Word understanding is excellent bilaterally at a normal level in the left ear and at an elevated level in the right tear.  Tympanometry revealed flat Type B tympanograms with large volume measures consistent with patent PE tubes in each ear.   See last audiogram dated 09/29/2013.  Changes since that time:  AD; Essentially stable  AS: significantly improved hearing.     Plan:  Patient to be seen in otology immediately following this visit.

## 2016-02-07 NOTE — Progress Notes (Signed)
CC:  Ear Problem (F/U Bilateral ETD. recently pt. was told by ENT Specialist in [place] that he had something in his Rt. ear)      Warren Dixon is a 35 year old year old male seen today for follow up of  of chronic bilateral middle ear disease and eustachian tube dysfunction. I recall that he underwent a right tympanomastoidectomy with cartilage graft reconstruction in May of 2012 for incipient cholesteatoma.  At his last visit on September 29, 2013 we placed a tube in the left ear.  He believes that both tubes are still in place.  Over the last several months he's had some ear infections in both ears, more bothersome on the left side.  These were initially treated with a course of oral antibiotics that was ineffective.  He subsequently had a course of Ciprodex eardrops and the infections resolved.  He feels as though he is doing relatively well today but there was some concern of a progressive process in his right, operated ear.  No current pain or drainage.  Hearing is stable.  No dizziness.    Pertinent Review of systems:  Pt denies: shortness of breath, palpitations, sweating, weakness, headache, visual changes, cough, fever, chills, weight loss, dysphagia, odynophagia, facial weakness, numbness, or voice changes.      No outpatient prescriptions have been marked as taking for the 02/07/16 encounter (Office Visit) with Dawayne PatriciaHume, Clifford Robert, MD.       Allergies  Review of patient's allergies indicates no known allergies.      Exam:  BP 141/84   Pulse 67     General appearance: healthy, alert, no distress, gait normal, voice slightly hoarse, alert and oriented    Eyes: Lids/periorbital skin normal, Conjunctivae/corneas clear, PERRL, EOM's intact, no nystagmus    Microscopic ear exam:   Right external ear, ear canal normal, TM - with intact T tube, patent with dry debris surrounding.  Looking through the tube it appears that the lumen medially is mucosalized.  I did try to open the tube but was not successful.   Anteriorly the middle ear space appears aerated.  There is no sign of infection.  I left the tube in place.  Left external ear, ear canal, TM - with T tube in place.  The tube is patent and the middle ear aerated.    Dix Hallpike normal    Nose/sinus exam: Nares normal. Septum midline. Mucosa normal. No drainage or sinus tenderness.  Oropharynx: normal, floor of mouth soft with no masses.     Neuro: cranial nerves 2-12 intact  Skin: Color, texture, turgor normal. No rashes or concerning lesions    Diagnostics  An audiogram was obtained today and is available for review.  In the left ear hearing is normal through 2 kHz dropping to 45 dBnHL hertz.  In the right ear, bone thresholds are symmetric through 1 kHz.  There is a 10-15 dB air-bone gap.  Word recognition is excellent bilaterally.  The tympanogram is consistent with a patent tube bilaterally.    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 No response to aggressive medical treatment.  As discussed in past, because of impact on hearing , he is interested in replacing tube in the left ear.  Right ear exam is  stable with no sign of recurrent cholesteatoma and a stable tube.        Assessment and Plan  We reviewed Warren Dixon's audiogram and ear exam in detail today.  Based on his exam today, both tympanostomy tubes appear patent and he has only a minimal conductive component to his hearing loss on the right side.  I do not see any sign of any active infection.  His hearing test is actually improved on the left side compared to when we saw him last in April 2015.  He does relate that his infections recently have tended to come after he has done some swimming.  We discussed obtaining swim plugs.  Overall did not see any concerning  findings on exam today.  I encouraged him to contact the clinic for a refill of topical antibiotic drops if he feels he has a recurrent infection.  This will likely be much more effective than oral antibiotics.    I recommended follow-up in 6 months to recheck his ears.  If the tube fully extrudes on the right side he may need reintubation.    I, Clifford R. Hume,  spent fifteen minutes of a twenty minute visit personally counseling Warren Dixon  related to the issues outlined above.

## 2016-02-25 ENCOUNTER — Encounter: Payer: Self-pay | Admitting: Internal Medicine

## 2016-03-03 ENCOUNTER — Telehealth: Payer: Self-pay | Admitting: Internal Medicine

## 2016-03-03 DIAGNOSIS — F410 Panic disorder [episodic paroxysmal anxiety] without agoraphobia: Secondary | ICD-10-CM

## 2016-03-03 NOTE — Telephone Encounter (Signed)
Patient would like refill (to pick up Rx from office) for ClonazePAM 1mg  tab.

## 2016-03-03 NOTE — Telephone Encounter (Signed)
Patient would like to pick up his klonopin prescription.

## 2016-03-05 MED ORDER — CLONAZEPAM 1 MG PO TABS: ORAL_TABLET | ORAL | 0 refills | Status: DC

## 2016-03-05 NOTE — Telephone Encounter (Signed)
Noted  

## 2016-03-05 NOTE — Telephone Encounter (Signed)
Pat informed patient as to Dr. Melissa Noon notes.  Patient will pick up Rx before 5:00 pm on March 05, 2016.

## 2016-04-04 ENCOUNTER — Telehealth: Payer: Self-pay

## 2016-04-04 DIAGNOSIS — F410 Panic disorder [episodic paroxysmal anxiety] without agoraphobia: Secondary | ICD-10-CM

## 2016-04-04 MED ORDER — CLONAZEPAM 1 MG PO TABS: ORAL_TABLET | ORAL | 0 refills | Status: DC

## 2016-04-04 NOTE — Telephone Encounter (Signed)
Patient called been out of Clonazepam for 3 days he is weaning off this medication per Dr. Amil Amen. He is out of medication for 3 days last refill was 03/05/16 would like another refill if possible

## 2016-04-11 NOTE — Telephone Encounter (Signed)
Rx picked up by patient

## 2016-04-23 ENCOUNTER — Emergency Department (HOSPITAL_COMMUNITY): Payer: Self-pay

## 2016-04-23 ENCOUNTER — Encounter (HOSPITAL_COMMUNITY): Payer: Self-pay | Admitting: *Deleted

## 2016-04-23 ENCOUNTER — Emergency Department (HOSPITAL_COMMUNITY)
Admission: EM | Admit: 2016-04-23 | Discharge: 2016-04-23 | Disposition: A | Discharge status: Home / Self Care | Payer: Self-pay | Attending: Emergency Medicine | Admitting: Emergency Medicine

## 2016-04-23 DIAGNOSIS — J209 Acute bronchitis, unspecified: Secondary | ICD-10-CM | POA: Insufficient documentation

## 2016-04-23 DIAGNOSIS — J40 Bronchitis, not specified as acute or chronic: Secondary | ICD-10-CM

## 2016-04-23 DIAGNOSIS — F17210 Nicotine dependence, cigarettes, uncomplicated: Secondary | ICD-10-CM | POA: Insufficient documentation

## 2016-04-23 MED ORDER — ALBUTEROL SULFATE HFA 108 (90 BASE) MCG/ACT IN AERS
2.0000 | INHALATION_SPRAY | Freq: Four times a day (QID) | RESPIRATORY_TRACT | Status: DC
  Administered 2016-04-23: 2 via RESPIRATORY_TRACT
  Filled 2016-04-23: qty 6.7

## 2016-04-23 MED ORDER — AZITHROMYCIN 250 MG PO TABS
500.0000 mg | ORAL_TABLET | Freq: Once | ORAL | Status: AC
  Administered 2016-04-23: 500 mg via ORAL
  Filled 2016-04-23: qty 2

## 2016-04-23 MED ORDER — PREDNISONE 20 MG PO TABS: 40.0000 mg | ORAL_TABLET | Freq: Every day | ORAL | 0 refills | Status: DC

## 2016-04-23 MED ORDER — PREDNISONE 20 MG PO TABS
60.0000 mg | ORAL_TABLET | ORAL | Status: AC
  Administered 2016-04-23: 60 mg via ORAL
  Filled 2016-04-23: qty 3

## 2016-04-23 MED ORDER — AZITHROMYCIN 250 MG PO TABS: 250.0000 mg | ORAL_TABLET | Freq: Every day | ORAL | 0 refills | Status: AC

## 2016-04-23 NOTE — ED Notes (Signed)
Checked patient blood sugar it was 404 notified RN Elmyra Ricks of blood sugar

## 2016-04-23 NOTE — ED Provider Notes (Signed)
[facility] DEPT Provider Note   CSN: [id number] Arrival date & time: 04/23/16  1301     History   Chief Complaint Chief Complaint  Patient presents with  . Fever  . Cough    HPI Chadron Duel is a 35 y.o. male.  HPI  Patient presents with concern of cough, chills, discomfort. Patient smokes, has history of anxiety, A. fib. He understands need to stop smoking, particularly given the presentation today. Symptoms began about 3 days ago, since that time he has had the aforementioned cough, chills, discomfort. No objective fever, no confusion, no disorientation, no syncope. Intermittent discomfort throughout the thorax, abdomen, but no persistent pain. Minimal relief with anything.  Past Medical History:  Diagnosis Date  . Anxiety   . Atrial fibrillation ([place])    Appears associated with alcohol abuse and withdrawal  . Depression    Has a counselor at [employer].  . ETOH abuse 2013   Relates to separation/divorce   . Gout 2011-2012  . 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 Pancreatic pseudocyst/cyst    Spring of 2015 per patient- states at [facility]    Patient Active Problem List   Diagnosis Date Noted  . Alcoholism ([place]) 08/06/2015  . Paroxysmal atrial fibrillation ([place]) 08/06/2015  . Anxiety disorder 08/06/2015  . Panic disorder 08/06/2015  . Depression 08/06/2015  . Pancreatitis 08/06/2015  . Gout 08/06/2015  . Kidney stones 08/06/2015  . Tourette's 08/06/2015    Past Surgical History:  Procedure Laterality Date  . colonoscopy with polypectomy  2016   [facility]--had rectal bleeding and one was precancerous       Home Medications    Prior to Admission medications   Medication Sig Start Date End Date Taking? Authorizing Provider  acetaminophen (TYLENOL) 325 MG tablet Take 2 tablets (650 mg total) by mouth every 6 (six) hours as needed. Patient not taking: Reported on 01/08/2016 06/15/15   Courteney Lyn Mackuen, MD  azithromycin (ZITHROMAX)  250 MG tablet Take 1 tablet (250 mg total) by mouth daily. Take 1 every day until finished. 04/24/16 04/28/16  Carmin Muskrat, MD  clonazePAM Bobbye [place]) 1 MG tablet 1/2 tab by mouth twice daily as needed for anxiety 04/04/16   Mack Hook, MD  diclofenac (VOLTAREN) 75 MG EC tablet Take 1 tablet (75 mg total) by mouth 2 (two) times daily. 10/15/15   Mack Hook, MD  diltiazem (CARDIZEM CD) 120 MG 24 hr capsule 1 tab once daily by mouth 08/06/15   Mack Hook, MD  gabapentin (NEURONTIN) 300 MG capsule Take 300 mg by mouth 3 (three) times daily.    [facility]  HYDROcodone-acetaminophen (NORCO/VICODIN) 5-325 MG tablet Take 1 tablet by mouth every 6 (six) hours as needed for severe pain. 01/08/16   Mack Hook, MD  penicillin v potassium (VEETID) 500 MG tablet Take 1 tablet (500 mg total) by mouth 4 (four) times daily. 01/08/16   Mack Hook, MD  predniSONE (DELTASONE) 20 MG tablet Take 2 tablets (40 mg total) by mouth daily with breakfast. For the next four days 04/23/16   Carmin Muskrat, MD  QUEtiapine Fumarate (SEROQUEL XR) 150 MG 24 hr tablet Take 200 mg by mouth at bedtime. Reported on 10/15/2015    Historical Provider, MD  sertraline (ZOLOFT) 100 MG tablet Take 100 mg by mouth daily.    Historical Provider, MD    Family History Family History  Problem Relation Age of Onset  . Breast cancer Mother 49  . Hyperlipidemia Mother   . Anxiety  disorder Mother   . Alcohol abuse Father   . Heart disease Father 74    Triple bypass   . COPD Father     chronic smoker  . Alcohol abuse Brother   . Depression Brother     Social History Social History  Substance Use Topics  . Smoking status: Current Every Day Smoker    Packs/day: 0.50    Years: 4.00    Types: Cigarettes  . Smokeless tobacco: Never Used  . Alcohol use No     Comment: Close to 200 days ago with treatment at [facility] and [facility] and alcohol free.  Going 2-3 times to AA weekly     Allergies     Morphine   Review of Systems Review of Systems  Constitutional:       Per HPI, otherwise negative  HENT:       Per HPI, otherwise negative  Respiratory:       Per HPI, otherwise negative  Cardiovascular:       Per HPI, otherwise negative  Gastrointestinal: Negative for vomiting.  Endocrine:       Negative aside from HPI  Genitourinary:       Neg aside from HPI   Musculoskeletal:       Per HPI, otherwise negative  Skin: Negative.   Neurological: Negative for syncope.     Physical Exam Updated Vital Signs BP 108/90 (BP Location: Right Arm)   Pulse 75   Temp 98.8 F (37.1 C) (Oral)   Resp 20   SpO2 100%   Physical Exam  Constitutional: He is oriented to person, place, and time. He appears well-developed. No distress.  HENT:  Head: Normocephalic and atraumatic.  Eyes: Conjunctivae and EOM are normal.  Cardiovascular: Normal rate and regular rhythm.   Pulmonary/Chest: Effort normal. No stridor.  Diminished breath sounds throughout  Abdominal: He exhibits no distension.  Musculoskeletal: He exhibits no edema.  Neurological: He is alert and oriented to person, place, and time.  Skin: Skin is warm and dry.  Psychiatric: He has a normal mood and affect.  Nursing note and vitals reviewed.    ED Treatments / Results  Labs (all labs ordered are listed, but only abnormal results are displayed) Labs Reviewed - No data to display  EKG  EKG Interpretation None       Radiology Dg Chest 2 View  Result Date: 04/23/2016 CLINICAL DATA:  Initial valuation for acute cough, fever. History smoking. EXAM: CHEST  2 VIEW COMPARISON:  Prior radiograph 09/15/2015. FINDINGS: Cardiac and mediastinal silhouettes are stable in size and contour, and remain within normal limits. Lungs are normally inflated. Scattered bronchitic changes present. No focal infiltrates. No pulmonary edema or pleural effusion. No pneumothorax. No acute osseus abnormality. IMPRESSION: Scattered  peribronchial thickening, which may reflect acute bronchiolitis and/or changes related to smoking. No focal infiltrates to suggest pneumonia. Electronically Signed   By: Jeannine Boga M.D.   On: 04/23/2016 13:40    Procedures Procedures (including critical care time)  Medications Ordered in ED Medications  predniSONE (DELTASONE) tablet 60 mg (not administered)  albuterol (PROVENTIL HFA;VENTOLIN HFA) 108 (90 Base) MCG/ACT inhaler 2 puff (not administered)  azithromycin (ZITHROMAX) tablet 500 mg (not administered)     Initial Impression / Assessment and Plan / ED Course  I have reviewed the triage vital signs and the nursing notes.  Pertinent labs & imaging results that were available during my care of the patient were reviewed by me and considered in  my medical decision making (see chart for details).  Clinical Course    Smoking cessation provided, particularly in light of this patient's evaluation in the ED.  Young male with history of smoking presents with ongoing cough, chills. On exam is awake and alert, not hypoxic, tachypneic, tachycardic, and there is low suspicion for occult PE, though the patient does have smoking as a risk factor. X-ray more consistent with bronchitis, likely with a cognition from the patient's smoking history. Patient discharged after initiation of albuterol, steroids, azithromycin.  Final Clinical Impressions(s) / ED Diagnoses   Final diagnoses:  Bronchitis  Acute bronchitis, unspecified organism    New Prescriptions New Prescriptions   AZITHROMYCIN (ZITHROMAX) 250 MG TABLET    Take 1 tablet (250 mg total) by mouth daily. Take 1 every day until finished.   PREDNISONE (DELTASONE) 20 MG TABLET    Take 2 tablets (40 mg total) by mouth daily with breakfast. For the next four days     Carmin Muskrat, MD 04/23/16 1418

## 2016-04-23 NOTE — ED Triage Notes (Signed)
Pt reports cough and cold symptoms for several days. Reports chest pain when breathing and coughing. Reports mucus is clear but having fever and fatigue.

## 2016-04-23 NOTE — Discharge Instructions (Signed)
As discussed, today's evaluation is resulted in a diagnosis of bronchitis. Please take all medication as directed, and use the provided albuterol inhaler every 4 hours for the next 2 days.  Please be sure to follow-up with your primary care physician and return here for concerning changes in your condition.

## 2016-04-23 NOTE — ED Notes (Signed)
Declined W/C at D/C and was escorted to lobby by RN. 

## 2016-05-06 ENCOUNTER — Telehealth: Payer: Self-pay | Admitting: Internal Medicine

## 2016-05-06 DIAGNOSIS — F410 Panic disorder [episodic paroxysmal anxiety] without agoraphobia: Secondary | ICD-10-CM

## 2016-05-06 NOTE — Telephone Encounter (Signed)
Patient called requesting refill for clonazePAM (KLONOPIN) 1 MG tablet

## 2016-05-07 MED ORDER — CLONAZEPAM 1 MG PO TABS: ORAL_TABLET | ORAL | 0 refills | Status: DC

## 2016-05-07 NOTE — Telephone Encounter (Signed)
May pick up klonopin rx

## 2016-05-08 NOTE — Telephone Encounter (Signed)
Rx picked up by patient

## 2016-05-17 ENCOUNTER — Emergency Department (HOSPITAL_COMMUNITY)
Admission: EM | Admit: 2016-05-17 | Discharge: 2016-05-17 | Disposition: A | Discharge status: Home / Self Care | Payer: Self-pay | Attending: Emergency Medicine | Admitting: Emergency Medicine

## 2016-05-17 ENCOUNTER — Encounter (HOSPITAL_COMMUNITY): Payer: Self-pay | Admitting: *Deleted

## 2016-05-17 DIAGNOSIS — Z79899 Other long term (current) drug therapy: Secondary | ICD-10-CM | POA: Insufficient documentation

## 2016-05-17 DIAGNOSIS — K0889 Other specified disorders of teeth and supporting structures: Secondary | ICD-10-CM | POA: Insufficient documentation

## 2016-05-17 DIAGNOSIS — J069 Acute upper respiratory infection, unspecified: Secondary | ICD-10-CM | POA: Insufficient documentation

## 2016-05-17 DIAGNOSIS — F17210 Nicotine dependence, cigarettes, uncomplicated: Secondary | ICD-10-CM | POA: Insufficient documentation

## 2016-05-17 MED ORDER — AMOXICILLIN-POT CLAVULANATE 875-125 MG PO TABS: 1.0000 | ORAL_TABLET | Freq: Two times a day (BID) | ORAL | 0 refills | Status: DC

## 2016-05-17 MED ORDER — BENZONATATE 100 MG PO CAPS
100.0000 mg | ORAL_CAPSULE | Freq: Three times a day (TID) | ORAL | 0 refills | Status: DC | PRN
Start: 2016-05-17 — End: 2020-11-28

## 2016-05-17 NOTE — ED Provider Notes (Signed)
[facility] DEPT Provider Note   CSN: [id number] Arrival date & time: 05/17/16  1055     History   Chief Complaint Chief Complaint  Patient presents with  . Cough  . Dental Pain    HPI Alejandro Flores is a 35 y.o. male.  The history is provided by the patient and medical records. No language interpreter was used.   Alejandro Flores is a 34 y.o. male  with a PMH of afib, tourette's who presents to the Emergency Department complaining of three days of upper right tooth pain after cracking tooth while eating. Patient is followed by dentist and has appointment on Friday. He has been alternating tylenol and ibuprofen for pain with moderate relief. For the last two days he has been having worsening nasal drainage and right sinus pressure associated with productive cough. Has tried saline nasal solution with no relief. Denies shortness of breath, chest pain, sore throat, fevers. He has a PCP who he can follow up with easily.   Past Medical History:  Diagnosis Date  . Anxiety   . Atrial fibrillation ([place])    Appears associated with alcohol abuse and withdrawal  . Depression    Has a counselor at [employer].  . ETOH abuse 2013   Relates to separation/divorce   . Gout 2011-2012  . 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 Pancreatic pseudocyst/cyst    Spring of 2015 per patient- states at [facility]    Patient Active Problem List   Diagnosis Date Noted  . Alcoholism ([place]) 08/06/2015  . Paroxysmal atrial fibrillation ([place]) 08/06/2015  . Anxiety disorder 08/06/2015  . Panic disorder 08/06/2015  . Depression 08/06/2015  . Pancreatitis 08/06/2015  . Gout 08/06/2015  . Kidney stones 08/06/2015  . Tourette's 08/06/2015    Past Surgical History:  Procedure Laterality Date  . colonoscopy with polypectomy  2016   [facility]--had rectal bleeding and one was precancerous       Home Medications    Prior to Admission medications   Medication Sig Start Date End Date Taking?  Authorizing Provider  acetaminophen (TYLENOL) 325 MG tablet Take 2 tablets (650 mg total) by mouth every 6 (six) hours as needed. Patient not taking: Reported on 01/08/2016 06/15/15   Courteney Lyn Mackuen, MD  amoxicillin-clavulanate (AUGMENTIN) 875-125 MG tablet Take 1 tablet by mouth every 12 (twelve) hours. 05/17/16   Keenon Leitzel Pilcher Shiniqua Groseclose, PA-C  benzonatate (TESSALON) 100 MG capsule Take 1 capsule (100 mg total) by mouth 3 (three) times daily as needed for cough. 05/17/16   Ozella Almond Tony Granquist, PA-C  clonazePAM (KLONOPIN) 1 MG tablet 1/2 tab by mouth twice daily as needed for anxiety 05/07/16   Mack Hook, MD  diclofenac (VOLTAREN) 75 MG EC tablet Take 1 tablet (75 mg total) by mouth 2 (two) times daily. 10/15/15   Mack Hook, MD  diltiazem (CARDIZEM CD) 120 MG 24 hr capsule 1 tab once daily by mouth 08/06/15   Mack Hook, MD  gabapentin (NEURONTIN) 300 MG capsule Take 300 mg by mouth 3 (three) times daily.    [facility]  HYDROcodone-acetaminophen (NORCO/VICODIN) 5-325 MG tablet Take 1 tablet by mouth every 6 (six) hours as needed for severe pain. 01/08/16   Mack Hook, MD  penicillin v potassium (VEETID) 500 MG tablet Take 1 tablet (500 mg total) by mouth 4 (four) times daily. 01/08/16   Mack Hook, MD  predniSONE (DELTASONE) 20 MG tablet Take 2 tablets (40 mg total) by mouth daily with breakfast. For the next four  days 04/23/16   Carmin Muskrat, MD  QUEtiapine Fumarate (SEROQUEL XR) 150 MG 24 hr tablet Take 200 mg by mouth at bedtime. Reported on 10/15/2015    Historical Provider, MD  sertraline (ZOLOFT) 100 MG tablet Take 100 mg by mouth daily.    Historical Provider, MD    Family History Family History  Problem Relation Age of Onset  . Breast cancer Mother 62  . Hyperlipidemia Mother   . Anxiety disorder Mother   . Alcohol abuse Father   . Heart disease Father 16    Triple bypass   . COPD Father     chronic smoker  . Alcohol abuse Brother   . Depression  Brother     Social History Social History  Substance Use Topics  . Smoking status: Current Every Day Smoker    Packs/day: 0.50    Years: 4.00    Types: Cigarettes  . Smokeless tobacco: Never Used  . Alcohol use No     Comment: Close to 200 days ago with treatment at [facility] and [facility] and alcohol free.  Going 2-3 times to AA weekly     Allergies   Morphine   Review of Systems Review of Systems  Constitutional: Negative for chills and fever.  HENT: Positive for congestion and dental problem. Negative for sore throat and trouble swallowing.   Eyes: Negative for visual disturbance.  Respiratory: Positive for cough. Negative for shortness of breath and wheezing.   Cardiovascular: Negative.   Gastrointestinal: Negative for abdominal pain, nausea and vomiting.  Genitourinary: Negative for dysuria.  Musculoskeletal: Negative for back pain and neck pain.  Skin: Negative for rash.  Neurological: Negative for headaches.     Physical Exam Updated Vital Signs BP 120/91   Pulse 86   Temp 98.2 F (36.8 C) (Oral)   Resp 17   Ht 5\' 10"  (1.778 m)   Wt 97.5 kg   SpO2 98%   BMI 30.85 kg/m   Physical Exam  Constitutional: He is oriented to person, place, and time. He appears well-developed and well-nourished. No distress.  HENT:  Head: Normocephalic and atraumatic.  Mouth/Throat: Uvula is midline, oropharynx is clear and moist and mucous membranes are normal.    Mild right maxillary tenderness. No trismus, oropharynx with erythema but no exudates or tonsillar hypertrophy.  Neck supple and no tenderness. No facial edema.   Cardiovascular: Normal rate, regular rhythm and normal heart sounds.   No murmur heard. Pulmonary/Chest: Effort normal and breath sounds normal. No respiratory distress.  Abdominal: Soft. He exhibits no distension. There is no tenderness.  Musculoskeletal: He exhibits no edema.  Neurological: He is alert and oriented to person, place, and time.   Skin: Skin is warm and dry.  Nursing note and vitals reviewed.    ED Treatments / Results  Labs (all labs ordered are listed, but only abnormal results are displayed) Labs Reviewed - No data to display  EKG  EKG Interpretation None       Radiology No results found.  Procedures Procedures (including critical care time)  Medications Ordered in ED Medications - No data to display   Initial Impression / Assessment and Plan / ED Course  I have reviewed the triage vital signs and the nursing notes.  Pertinent labs & imaging results that were available during my care of the patient were reviewed by me and considered in my medical decision making (see chart for details).  Clinical Course    Braylenn Mohammadi is a  35 y.o. male who presents to ED for cracked upper right tooth x 3 days and URI symptoms. On exam, patient is afebrile, nontoxic appearing with clear lung sounds bilaterally. No abscess to the tooth. He does have mild tenderness to palpation of the maxillary sinus. Will place on Augmentin to cover for both sinusitis and possible dental infection. Patient has a appointment already scheduled with his dentist on Friday. He was encouraged to keep this scheduled appointment. PCP follow-up if your symptoms do not improve. Reasons to return to ED discussed and all questions answered.    Final Clinical Impressions(s) / ED Diagnoses   Final diagnoses:  Pain, dental  Upper respiratory tract infection, unspecified type    New Prescriptions Discharge Medication List as of 05/17/2016 12:24 PM    START taking these medications   Details  amoxicillin-clavulanate (AUGMENTIN) 875-125 MG tablet Take 1 tablet by mouth every 12 (twelve) hours., Starting Sat 05/17/2016, Print    benzonatate (TESSALON) 100 MG capsule Take 1 capsule (100 mg total) by mouth 3 (three) times daily as needed for cough., Starting Sat 05/17/2016, Print         [employer] Mane Consolo, PA-C 05/17/16 1326      Lacretia Leigh, MD 05/18/16 [phone]

## 2016-05-17 NOTE — Discharge Instructions (Signed)
It was my pleasure taking care of you today!  Please take all of your antibiotics until finished! Please keep your scheduled appointment with the dentist on Friday.  Tessalon as needed for cough.  Follow up with primary care provider if symptoms do not improve. Return to ER for new or worsening symptoms, any additional concerns.

## 2016-05-17 NOTE — ED Notes (Signed)
Pt also reporting nasal discharge that is green with some blood noted in it as well.

## 2016-05-17 NOTE — ED Triage Notes (Signed)
Patient is alert and oriented x4.  He is complaining of a cracked tooth after 3 days.  Patient states that he was eating when the tooth cracked.  Currently he rates his pain 6 of 10.  Patient also has a complaint of sinus pain.  Currently he rates that pain 8 of 10.

## 2016-06-03 ENCOUNTER — Telehealth: Payer: Self-pay

## 2016-06-03 DIAGNOSIS — F410 Panic disorder [episodic paroxysmal anxiety] without agoraphobia: Secondary | ICD-10-CM

## 2016-06-03 NOTE — Telephone Encounter (Signed)
Patient called in requesting a refill on his Clonazepam 1 mg. States he usually picks up Rx.

## 2016-06-05 MED ORDER — CLONAZEPAM 1 MG PO TABS: ORAL_TABLET | ORAL | 0 refills | Status: DC

## 2016-06-05 NOTE — Telephone Encounter (Signed)
Left message on voicemail informing patient his Rx will be ready on Friday.

## 2016-06-06 NOTE — Telephone Encounter (Signed)
Pt. Picked up Rx.

## 2016-07-03 ENCOUNTER — Telehealth: Payer: Self-pay | Admitting: Internal Medicine

## 2016-07-03 NOTE — Telephone Encounter (Signed)
Patient called requesting refill for clonazePAM (KLONOPIN) 1 MG tablet

## 2016-07-03 NOTE — Telephone Encounter (Signed)
To Dr. Mulberry for approval 

## 2016-07-11 ENCOUNTER — Other Ambulatory Visit: Payer: Self-pay

## 2016-07-11 DIAGNOSIS — F410 Panic disorder [episodic paroxysmal anxiety] without agoraphobia: Secondary | ICD-10-CM

## 2016-07-11 MED ORDER — CLONAZEPAM 1 MG PO TABS: ORAL_TABLET | ORAL | 0 refills | Status: DC

## 2016-07-11 NOTE — Telephone Encounter (Signed)
Cherice printed and signed

## 2016-08-07 ENCOUNTER — Telehealth: Payer: Self-pay

## 2016-08-07 NOTE — Telephone Encounter (Signed)
Patient called for a refill of his Clonazepam 1 mg. Last filled on 07/11/16. To Dr. Amil Amen for approval.

## 2016-08-14 ENCOUNTER — Encounter (HOSPITAL_BASED_OUTPATIENT_CLINIC_OR_DEPARTMENT_OTHER): Payer: No Typology Code available for payment source | Admitting: Otology & Neurotology

## 2016-08-15 ENCOUNTER — Other Ambulatory Visit: Payer: Self-pay | Admitting: Internal Medicine

## 2016-08-15 DIAGNOSIS — F410 Panic disorder [episodic paroxysmal anxiety] without agoraphobia: Secondary | ICD-10-CM

## 2016-08-15 DIAGNOSIS — F325 Major depressive disorder, single episode, in full remission: Secondary | ICD-10-CM | POA: Insufficient documentation

## 2016-08-15 MED ORDER — CLONAZEPAM 1 MG PO TABS: ORAL_TABLET | ORAL | 0 refills | Status: DC

## 2016-08-15 NOTE — Telephone Encounter (Signed)
LVM

## 2016-08-18 ENCOUNTER — Telehealth: Payer: Self-pay | Admitting: Internal Medicine

## 2016-08-18 NOTE — Telephone Encounter (Signed)
To Dr. Mulberry  

## 2016-08-18 NOTE — Telephone Encounter (Signed)
Patient went to [facility] per referal from this practice. Patient will now be able to be seen at [facility], PCP will be Dr. Lurlean Nanny.  Patient wanted this practice to know that he is transferring to Dr. Mancel Bale practice as [facility] will now be able to attend to patient's medical needs and serve as his PCP.  Patient thanks this practice for all that has been done for him.  More than thank you.

## 2016-08-22 ENCOUNTER — Other Ambulatory Visit: Payer: Self-pay

## 2016-08-22 MED ORDER — GABAPENTIN 300 MG PO CAPS: 300.0000 mg | ORAL_CAPSULE | Freq: Three times a day (TID) | ORAL | 1 refills | Status: DC

## 2016-08-28 ENCOUNTER — Ambulatory Visit
Payer: No Typology Code available for payment source | Attending: Otology & Neurotology | Admitting: Otology & Neurotology

## 2016-08-28 ENCOUNTER — Encounter (HOSPITAL_BASED_OUTPATIENT_CLINIC_OR_DEPARTMENT_OTHER): Payer: Self-pay | Admitting: Otology & Neurotology

## 2016-08-28 ENCOUNTER — Telehealth (HOSPITAL_BASED_OUTPATIENT_CLINIC_OR_DEPARTMENT_OTHER): Payer: Self-pay | Admitting: Otology & Neurotology

## 2016-08-28 VITALS — BP 144/90 | HR 70

## 2016-08-28 DIAGNOSIS — H6983 Other specified disorders of Eustachian tube, bilateral: Secondary | ICD-10-CM | POA: Insufficient documentation

## 2016-08-28 DIAGNOSIS — H7191 Unspecified cholesteatoma, right ear: Secondary | ICD-10-CM | POA: Insufficient documentation

## 2016-08-28 DIAGNOSIS — J301 Allergic rhinitis due to pollen: Secondary | ICD-10-CM

## 2016-08-28 DIAGNOSIS — H6522 Chronic serous otitis media, left ear: Secondary | ICD-10-CM | POA: Insufficient documentation

## 2016-08-28 DIAGNOSIS — H6520 Chronic serous otitis media, unspecified ear: Secondary | ICD-10-CM | POA: Insufficient documentation

## 2016-08-28 MED ORDER — FLUTICASONE PROPIONATE 50 MCG/ACT NA SUSP
2.0000 | Freq: Every day | NASAL | 3 refills | Status: AC
Start: 2016-08-28 — End: ?

## 2016-08-28 NOTE — Progress Notes (Signed)
CC:  Ear Problem (Pt. returns for 6 month F/U on bilateral ETD. Pt. c/o Rt. ear intermittent pressure and popping. )      Warren Dixon is a 36 year old year old male seen today for follow up of  of chronic bilateral middle ear disease and eustachian tube dysfunction. I recall that he underwent a right tympanomastoidectomy with cartilage graft reconstruction in May of 2012 for incipient cholesteatoma.  In September 29, 2013 we placed a tube in the left ear.  He believes that both tubes are still in place.  He has noticed that his seasonal allergies are returning.  He feels as though the hearing on the right side may be down a little bit and there is a mild sensation of pressure.  He has not used any recent eardrops.  No current pain or drainage. No dizziness.    Pertinent Review of systems:  Pt denies: shortness of breath, palpitations, sweating, weakness, headache, visual changes, cough, fever, chills, weight loss, dysphagia, odynophagia, facial weakness, numbness, or voice changes.      No outpatient prescriptions have been marked as taking for the 08/28/16 encounter (Office Visit) with Dawayne Patricia, MD.       Allergies  Patient has no known allergies.      Exam:  BP 144/90   Pulse 70     General appearance: healthy, alert, no distress, gait normal, voice slightly hoarse, alert and oriented    Eyes: Lids/periorbital skin normal, Conjunctivae/corneas clear, PERRL, EOM's intact, no nystagmus    Microscopic ear exam:   Right external ear, ear canal normal, TM - with T tube in place.  After manipulating the tube, however, it is clear that the tympanic membrane has healed medially and the tube is no longer functional.  There is no sign of infection or concerning retraction.  Left external ear, ear canal, TM - with T tube in place.  The tube is patent and the middle ear aerated.    Dix Hallpike normal    Nose/sinus exam: Nares normal. Septum midline. Mucosa normal. No drainage or sinus tenderness.  Oropharynx: normal,  floor of mouth soft with no masses.     Neuro: cranial nerves 2-12 intact  Skin: Color, texture, turgor normal. No rashes or concerning lesions    Diagnostics  An audiogram was obtained February 07, 2016 and is available for review.  In the left ear hearing is normal through 2 kHz dropping to 45 dBnHL hertz.  In the right ear, bone thresholds are symmetric through 1 kHz.  There is a 10-15 dB air-bone gap.  Word recognition is excellent bilaterally.  The tympanogram is consistent with a patent tube bilaterally.    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  On the right side, the tube has become plugged and the tympanic membrane has healed.  There are no concerning signs of retraction, but he does notice some change in his hearing.  I encouraged him to restart his typical spring allergy regimen.  We will have him follow-up in 1-2 months with a repeat hearing test.  If his hearing has declined we will consider reintubation of the right ear.  Because of his extensive surgery  we can attempt this in clinic, but may require general anesthesia.      We also discussed that with his history of bilateral middle ear disease for many years, and multiple sets of ear tubes, he may be a good candidate for eustachian tube balloon dilation bilaterally in conjunction with placement of pressure equalization tubes under general anesthesia.    I, Clifford R. Hume,  spent fifteen minutes of a twenty minute visit personally counseling Warren Dixon  related to the issues outlined above.

## 2016-08-28 NOTE — Telephone Encounter (Signed)
Appt. Scheduled for 10/30/16 at 2:30pm. Pt. Notified via voice mail. Advised to call us if need to change.

## 2016-08-28 NOTE — Telephone Encounter (Signed)
No open slot for RHT in 1-5452mo. Next available is 6/28/gaa

## 2016-09-12 ENCOUNTER — Telehealth: Payer: Self-pay | Admitting: Internal Medicine

## 2016-09-12 NOTE — Telephone Encounter (Signed)
noted 

## 2016-09-12 NOTE — Telephone Encounter (Signed)
Patient called requesting refill of clonazePAM (KLONOPIN) 1 mg tablet.  Pat spoke with nurse, who informed Alejandro Flores that previous notes state patient is now with another physician and should contact that physician for refill.  Alejandro Flores called the patient and left voice message to that regard and asked that patient call the office with any further questions/ concerns.

## 2016-10-30 ENCOUNTER — Ambulatory Visit (HOSPITAL_BASED_OUTPATIENT_CLINIC_OR_DEPARTMENT_OTHER): Payer: No Typology Code available for payment source | Admitting: Audiologist

## 2016-10-30 ENCOUNTER — Ambulatory Visit: Payer: No Typology Code available for payment source | Attending: Audiologist | Admitting: Otology & Neurotology

## 2016-10-30 VITALS — BP 134/91 | HR 61 | Ht 73.0 in | Wt 231.0 lb

## 2016-10-30 DIAGNOSIS — Z6830 Body mass index (BMI) 30.0-30.9, adult: Secondary | ICD-10-CM

## 2016-10-30 DIAGNOSIS — H905 Unspecified sensorineural hearing loss: Secondary | ICD-10-CM

## 2016-10-30 DIAGNOSIS — H6983 Other specified disorders of Eustachian tube, bilateral: Secondary | ICD-10-CM | POA: Insufficient documentation

## 2016-10-30 DIAGNOSIS — H6522 Chronic serous otitis media, left ear: Secondary | ICD-10-CM | POA: Insufficient documentation

## 2016-10-30 DIAGNOSIS — J301 Allergic rhinitis due to pollen: Secondary | ICD-10-CM | POA: Insufficient documentation

## 2016-10-30 DIAGNOSIS — H906 Mixed conductive and sensorineural hearing loss, bilateral: Secondary | ICD-10-CM | POA: Insufficient documentation

## 2016-10-30 DIAGNOSIS — H90A31 Mixed conductive and sensorineural hearing loss, unilateral, right ear with restricted hearing on the contralateral side: Secondary | ICD-10-CM | POA: Insufficient documentation

## 2016-10-30 DIAGNOSIS — H9042 Sensorineural hearing loss, unilateral, left ear, with unrestricted hearing on the contralateral side: Secondary | ICD-10-CM | POA: Insufficient documentation

## 2016-10-30 NOTE — Progress Notes (Signed)
CC:  Hearing Problem (Bilat ETD 34mo f/u RHT)      Warren Dixon is a 36 year old year old male seen today for follow up of  of chronic bilateral middle ear disease and eustachian tube dysfunction. I recall that he underwent a right tympanomastoidectomy with cartilage graft reconstruction in May of 2012 for incipient cholesteatoma.  In September 29, 2013 we placed a tube in the left ear.  At his last visit on March 8 we noted that the tube on the right side had extruded and the left side tube was patent.  He restarted his allergy medications and feels that his symptoms have improved slightly.  He still has some mild fullness or pressure worse on the right side.  No current pain or drainage. No dizziness.    Pertinent Review of systems:  Pt denies: shortness of breath, palpitations, sweating, weakness, headache, visual changes, cough, fever, chills, weight loss, dysphagia, odynophagia, facial weakness, numbness, or voice changes.      Outpatient Prescriptions Marked as Taking for the 10/30/16 encounter (Office Visit) with Dawayne Patricia, MD   Medication Sig Dispense Refill    Cetirizine HCl (ZYRTEC OR)       Fluticasone Propionate 50 MCG/ACT Nasal Suspension Spray 2 sprays into each nostril daily. 2 Inhaler 3       Allergies  Patient has no known allergies.      Exam:  BP (!) 134/91    Pulse 61    Ht 6\' 1"  (1.854 m)    Wt (!) 231 lb (104.8 kg)    BMI 30.48 kg/m     General appearance: healthy, alert, no distress, gait normal, voice slightly hoarse, alert and oriented    Eyes: Lids/periorbital skin normal, Conjunctivae/corneas clear, PERRL, EOM's intact, no nystagmus    Microscopic ear exam:   Right external ear, ear canal normal, TM - Intact.  There is no sign of infection or concerning retraction.  Left external ear, ear canal, TM - with T tube in place.  The tube is patent and the middle ear aerated.    Dix Hallpike normal    Nose/sinus exam: Nares normal. Septum midline. Mucosa normal. No drainage or sinus  tenderness.  Oropharynx: normal, floor of mouth soft with no masses.     Neuro: cranial nerves 2-12 intact  Skin: Color, texture, turgor normal. No rashes or concerning lesions    Diagnostics  An audiogram was obtained today and is available for review.  In the left ear hearing is normal through 2 kHz dropping to 45 dB at 6-8 kHz.  In the right ear, bone thresholds are symmetric through 1 kHz.  There is a 10-20 dB air-bone gap.  Hearing is improved by 15-25 dB in the right ear by comparison to February 07, 2016.  Word recognition is excellent bilaterally.  The tympanogram is consistent with a patent tube on the left side and an intact tympanic membrane on the right with decreased compliance.    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  On the right side, the tube extruded, but his hearing has improved and I do not see any concerning signs of retraction or a recurrent effusion.  I encouraged him to continue with his fluticasone nasal spray and  start and oral antihistamine.    We also discussed that with his history of bilateral middle ear disease for many years, and multiple sets of ear tubes, he may be a good candidate for eustachian tube balloon dilation bilaterally in conjunction with placement of pressure equalization tubes if his symptoms recur.    I recommended follow-up in 6 months.    I, Hervey Wedig R. Abree Romick,  spent fifteen minutes of a twenty minute visit personally counseling Warren Dixon  related to the issues outlined above.

## 2016-10-30 NOTE — Progress Notes (Signed)
Audiology Note    Mr. Warren AcresSean Patrick Dixon referred by Dr. Leonides Cavelifford Hume, MD, PhD for audiogram in conjunction with Otology visit. This is an Audiology note for Otology clinic. Details of today's visit can be found in Dr. Leighton ParodyHume's dictation.    S: 36 year old male with Hx of chronic bilateral middle ear disease and left PE tube returns for follow up. Pt. reports no significant changes in hearing since last test 02/07/2016. He denies otalgia or otorrhea.     O/A:   Otoscopy: visual inspection by otoscopy revealed non-occluding external auditory canals bilaterally.  Audiogram and tympanogram performed.  See results under media tab of this encounter.   SEE AUDIOGRAM- Compared to previous audiogram 02/07/2016, the following changes are noted: Right: 15-25 dB AC improvement at 2000-4000 Hz. Left: Stable hearing.  SEE TYMP- Right: Type B: No TM compliance with normal ear canal volume. Left: Type B: No TM compliance with large ear canal volume, consistent with patent PE tube.    P: Mr. Margo AyeHall to be seen in Otology by Dr. Berkley HarveyHume immediately following this appointment.

## 2017-03-13 DIAGNOSIS — F958 Other tic disorders: Secondary | ICD-10-CM | POA: Insufficient documentation

## 2017-05-07 ENCOUNTER — Ambulatory Visit (HOSPITAL_BASED_OUTPATIENT_CLINIC_OR_DEPARTMENT_OTHER): Payer: No Typology Code available for payment source

## 2017-05-07 ENCOUNTER — Ambulatory Visit (HOSPITAL_BASED_OUTPATIENT_CLINIC_OR_DEPARTMENT_OTHER): Payer: No Typology Code available for payment source | Admitting: Otology & Neurotology

## 2017-05-07 ENCOUNTER — Ambulatory Visit
Payer: No Typology Code available for payment source | Attending: Otology & Neurotology | Admitting: Otology & Neurotology

## 2017-05-07 VITALS — BP 130/79 | HR 56 | Ht 73.0 in | Wt 231.0 lb

## 2017-05-07 DIAGNOSIS — Z6830 Body mass index (BMI) 30.0-30.9, adult: Secondary | ICD-10-CM

## 2017-05-07 DIAGNOSIS — H6983 Other specified disorders of Eustachian tube, bilateral: Secondary | ICD-10-CM

## 2017-05-07 DIAGNOSIS — H906 Mixed conductive and sensorineural hearing loss, bilateral: Secondary | ICD-10-CM | POA: Insufficient documentation

## 2017-05-07 DIAGNOSIS — H7191 Unspecified cholesteatoma, right ear: Secondary | ICD-10-CM | POA: Insufficient documentation

## 2017-05-08 NOTE — Progress Notes (Signed)
CC:  Hearing Problem (31mo f/u ear check)      Warren Dixon is a 36 year old year old male seen today for follow up of  of chronic bilateral middle ear disease and eustachian tube dysfunction. I recall that he underwent a right tympanomastoidectomy with cartilage graft reconstruction in May of 2012 for incipient cholesteatoma.  In September 29, 2013 we placed a tube in the left ear. He continues with his allergy medications and feels that his symptoms have remained stable.  He still has some mild fullness or pressure worse on the right side.  No current pain or drainage. No dizziness.    Pertinent Review of systems:  Pt denies: shortness of breath, palpitations, sweating, weakness, headache, visual changes, cough, fever, chills, weight loss, dysphagia, odynophagia, facial weakness, numbness, or voice changes.      Outpatient Prescriptions Marked as Taking for the 05/07/17 encounter (Office Visit) with Dawayne PatriciaHume, Johnna Bollier Robert, MD   Medication Sig Dispense Refill    Cetirizine HCl (ZYRTEC OR)       Fluticasone Propionate 50 MCG/ACT Nasal Suspension Spray 2 sprays into each nostril daily. 2 Inhaler 3       Allergies  Patient has no known allergies.      Exam:  BP 130/79    Pulse 56    Ht 6\' 1"  (1.854 m)    Wt (!) 231 lb (104.8 kg)    BMI 30.48 kg/m     General appearance: healthy, alert, no distress, gait normal, voice slightly hoarse, alert and oriented    Eyes: Lids/periorbital skin normal, Conjunctivae/corneas clear, PERRL, EOM's intact, no nystagmus    Microscopic ear exam:   Right external ear, ear canal normal, TM - reconstructed with cartilage, Intact. Aerated anteriorly  There is no sign of infection or concerning retraction.  Left external ear, ear canal, TM - with T tube in place.  The tube is patent and the middle ear aerated.    Dix Hallpike normal    Nose/sinus exam: Nares normal. Septum midline. Mucosa normal. No drainage or sinus tenderness.  Oropharynx: normal, floor of mouth soft with no masses.     Neuro:  cranial nerves 2-12 intact  Skin: Color, texture, turgor normal. No rashes or concerning lesions    Diagnostics  An audiogram was obtained 10/30/2016 and is available for review.  In the left ear hearing is normal through 2 kHz dropping to 45 dB at 6-8 kHz.  In the right ear, bone thresholds are symmetric through 1 kHz.  There is a 10-20 dB air-bone gap.  Hearing is improved by 15-25 dB in the right ear by comparison to February 07, 2016.  Word recognition is excellent bilaterally.  The tympanogram is consistent with a patent tube on the left side and an intact tympanic membrane on the right with decreased compliance.    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  On the right side reconstruction is also stable and I do not see any concerning signs of retraction or a recurrent cholesteatoma or effusion.  I encouraged him to continue with his fluticasone nasal spray and oral antihistamine.    We also discussed that with his history of bilateral middle ear disease for many years, and multiple sets of ear tubes, he may be a good candidate for eustachian  tube balloon dilation bilaterally in conjunction with placement of pressure equalization tubes if his symptoms recur.    I recommended follow-up in 6-12 months with new hearing test.    I, Oriel Ojo R. Tyrea Froberg,  spent fifteen minutes of a twenty minute visit personally counseling Warren Dixon  related to the issues outlined above.

## 2018-07-13 ENCOUNTER — Telehealth (HOSPITAL_BASED_OUTPATIENT_CLINIC_OR_DEPARTMENT_OTHER): Payer: Self-pay | Admitting: Otology & Neurotology

## 2018-07-13 DIAGNOSIS — H6981 Other specified disorders of Eustachian tube, right ear: Secondary | ICD-10-CM

## 2018-07-13 DIAGNOSIS — H7191 Unspecified cholesteatoma, right ear: Secondary | ICD-10-CM

## 2018-07-13 NOTE — Telephone Encounter (Signed)
RETURN CALL: Voicemail - Detailed Message      SUBJECT:  Refill Request    NAME OF MEDICATION(S): Ear infection medications  DATE NEEDED BY: as soon as possible   PRESCRIBING PROVIDER: Hume  PHARMACY NAME/LOCATION: [facility] in [place]  ADDITIONAL INFORMATION: Patient states he was told to send message to provider if he can't get in quickly to have a prescription called in

## 2018-07-14 MED ORDER — OFLOXACIN 0.3 % OT SOLN
5.0000 [drp] | Freq: Two times a day (BID) | OTIC | 2 refills | Status: DC
Start: 2018-07-14 — End: 2021-02-18

## 2018-07-14 NOTE — Telephone Encounter (Signed)
I called Warren Dixon in response to this message. He states he had a cold 3 weeks ago and his R ear has been clogged with diminished hearing and some drainage since. He denies pain. He cannot remember if he used ofloxacin or Ciprodex drops most recently, will message Dr. Berkley HarveyHume for recommendations. He is scheduled for a f/u appt on 11/18/18.

## 2018-07-14 NOTE — Addendum Note (Signed)
Addended by: Valentina Gu on: 07/14/2018 04:14 PM     Modules accepted: Orders

## 2018-07-14 NOTE — Telephone Encounter (Signed)
Per Dr. Berkley HarveyHume, ok to refill ofloxacin drops. This was called in to his [facility] pharmacy today. RBTO. I let Gregary SignsSean know this will be ready for him to pick up this evening and encouraged him to call us back with any further questions or concerns. He states understanding.

## 2018-11-18 ENCOUNTER — Ambulatory Visit
Payer: No Typology Code available for payment source | Attending: Otology & Neurotology | Admitting: Otology & Neurotology

## 2018-11-18 ENCOUNTER — Ambulatory Visit (HOSPITAL_BASED_OUTPATIENT_CLINIC_OR_DEPARTMENT_OTHER): Payer: No Typology Code available for payment source | Admitting: Audiologist

## 2018-11-18 ENCOUNTER — Encounter (HOSPITAL_BASED_OUTPATIENT_CLINIC_OR_DEPARTMENT_OTHER): Payer: Self-pay | Admitting: Otology & Neurotology

## 2018-11-18 VITALS — BP 144/75 | HR 58 | Temp 97.8°F | Ht 72.0 in | Wt 225.0 lb

## 2018-11-18 DIAGNOSIS — H90A31 Mixed conductive and sensorineural hearing loss, unilateral, right ear with restricted hearing on the contralateral side: Secondary | ICD-10-CM | POA: Insufficient documentation

## 2018-11-18 DIAGNOSIS — H7191 Unspecified cholesteatoma, right ear: Secondary | ICD-10-CM | POA: Insufficient documentation

## 2018-11-18 DIAGNOSIS — H906 Mixed conductive and sensorineural hearing loss, bilateral: Secondary | ICD-10-CM | POA: Insufficient documentation

## 2018-11-18 DIAGNOSIS — H6983 Other specified disorders of Eustachian tube, bilateral: Secondary | ICD-10-CM | POA: Insufficient documentation

## 2018-11-18 NOTE — Progress Notes (Signed)
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 He underwent R tympanomastoidectomy with cartilage graft reconstruction in May 2012 for cholesteatoma. On 09/29/13 he got a tube in his left ear. He continues on allergy medication and denies popping or crackling in his ears recently. His pressure and fullness on his right side has resolved. He denies pain or drainage bilaterally. He denies dizziness. He has never used hearing aids.     He comes to clinic today due to a cold a few month ago after which he experienced prolonged decrease in his right sided hearing. He feels like it has now returned to his baseline. He also has noticed tinnitus, but it only bothers him when he is in a quiet room or focusing on it.     Review of systems:    Denies shortness of breath, palpitations, sweating, weakness, headache, visual changes, cough, numbness, fever, chills, weight loss, dysphagia, odynophagia, facial weakness, voice changes    Outpatient Medications Marked as Taking for the 11/18/18 encounter (Office Visit) with Dawayne Patricia, MD   Medication Sig Dispense Refill   . Cetirizine HCl (ZYRTEC OR)      . Fluticasone Propionate 50 MCG/ACT Nasal Suspension Spray 2 sprays into each nostril daily. 2 Inhaler 3   . ofloxacin 0.3 % otic solution Place 5 drops into right ear 2 times a day. Until bottle gone. 10 mL 2     Allergies  Patient has no known allergies.    Exam:  BP (!) 144/75   Pulse 58   Temp 97.8 F (36.6 C) (Temporal)   Ht 6' (1.829 m)   Wt (!) 225 lb (102.1 kg)   BMI 30.52 kg/m     General appearance: healthy, alert, no distress, gait normal, voice mildly hoarse, alert and oriented  Eyes: Lids/periorbital skin normal, Conjunctivae/corneas clear, PERRL, EOM's intact, no nystagmus    Microscopic ear exam:   Right external ear and ear canal normal, TM intact and reconstructed with cartilage. Aerated without  infection or retraction.  Left external ear and ear canal normal. TM in neutral position with bright green T tube in place. Tube is patent and middle ear is aerated without drainage. Mild posterior crusting.    Dix Hallpike normal    Facial nerve function intact.    Diagnostic Studies  An audiogram was obtained 11/18/2018 and is available for review.  It was compared to the most recent audiogram from 10/30/2016 and his hearing was found to be essentially stable bilaterally. His bilateral tymps were stiff and flat. R ear with normal canal volume (intact TM) and L ear with large canal volume (patent T tube). In the left ear hearing is normal through 3 kHz dropping to 55 dB at 8 kHz. In the right ear there is mild hearing loss through 3 kHz dropping to 80-90 dB at 6-8 kHz. There is a 5-25 dB air-bone gap throughout bilaterally.  Word recognition is excellent bilaterally.     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 The right sided cartilage reconstruction is intact without retraction, effusion, or cholesteatoma. He should continue on fluticasone nasal spray and oral antihistamine to  prevent retraction from his ETD.     He is cleared for hearing aids bilaterally if his hearing is causing him issues. At this time he will think about it. A clearance letter will be sent to him home.     Plan:  - Follow up in 12 months with hearing test at that time.

## 2018-11-18 NOTE — Progress Notes (Signed)
Audiology Note    Mr. Warren Dixon referred by Dr. Leonides Cave, MD, PhD for audiogram in conjunction with Otology visit. This is an Audiology note for Otology clinic. Details of today's visit can be found in Dr. Leighton Parody dictation.    S: 38 year old male with Hx of chronic bilateral middle ear disease returns for annual follow-up. Pt. reports stable hearing bilaterally since last test in 2018. He denies otalgia or otorrhea.    O/A:   Audiogram and tympanogram performed.  See results under media tab of this encounter.   SEE AUDIOGRAM- Compared to previous audiogram 10/30/2016, hearing has remained essentially stable bilaterally.  SEE TYMP- Right: Type B: No TM compliance with normal ear canal volume. Left: Type B: No TM compliance with large ear canal volume.    P: [name] to be seen in Otology by Dr. Berkley Harvey immediately following this appointment.

## 2018-11-21 NOTE — Progress Notes (Signed)
I, Clifford R. Berkley Harvey, personally examined Warren Dixon, elicited the history and reviewed the diagnostic studies with the patient and our resident Dr. Adolph Pollack.   I  spent 15 minutes of a twenty minute encounter counseling Warren Dixon related to the issues outlined in the edited note and plan that are appended.

## 2020-11-26 ENCOUNTER — Emergency Department (HOSPITAL_COMMUNITY): Payer: PRIVATE HEALTH INSURANCE

## 2020-11-26 ENCOUNTER — Emergency Department (HOSPITAL_COMMUNITY)
Admission: EM | Admit: 2020-11-26 | Discharge: 2020-11-26 | Disposition: A | Discharge status: Home / Self Care | Payer: PRIVATE HEALTH INSURANCE | Attending: Emergency Medicine | Admitting: Emergency Medicine

## 2020-11-26 ENCOUNTER — Encounter (HOSPITAL_COMMUNITY): Payer: Self-pay | Admitting: Emergency Medicine

## 2020-11-26 ENCOUNTER — Other Ambulatory Visit: Payer: Self-pay

## 2020-11-26 DIAGNOSIS — K529 Noninfective gastroenteritis and colitis, unspecified: Secondary | ICD-10-CM | POA: Diagnosis not present

## 2020-11-26 DIAGNOSIS — Z20822 Contact with and (suspected) exposure to covid-19: Secondary | ICD-10-CM | POA: Diagnosis not present

## 2020-11-26 DIAGNOSIS — F17210 Nicotine dependence, cigarettes, uncomplicated: Secondary | ICD-10-CM | POA: Insufficient documentation

## 2020-11-26 DIAGNOSIS — R1011 Right upper quadrant pain: Secondary | ICD-10-CM

## 2020-11-26 DIAGNOSIS — R1084 Generalized abdominal pain: Secondary | ICD-10-CM | POA: Diagnosis present

## 2020-11-26 LAB — URINALYSIS, ROUTINE W REFLEX MICROSCOPIC
Bacteria, UA: NONE SEEN
Bilirubin Urine: NEGATIVE
Glucose, UA: NEGATIVE mg/dL
Ketones, ur: 5 mg/dL — AB
Leukocytes,Ua: NEGATIVE
Nitrite: NEGATIVE
Protein, ur: 100 mg/dL — AB
Specific Gravity, Urine: 1.03 (ref 1.005–1.030)
pH: 5 (ref 5.0–8.0)

## 2020-11-26 LAB — COMPREHENSIVE METABOLIC PANEL
ALT: 42 U/L (ref 0–44)
AST: 30 U/L (ref 15–41)
Albumin: 5.1 g/dL — ABNORMAL HIGH (ref 3.5–5.0)
Alkaline Phosphatase: 72 U/L (ref 38–126)
Anion gap: 15 (ref 5–15)
BUN: 16 mg/dL (ref 6–20)
CO2: 19 mmol/L — ABNORMAL LOW (ref 22–32)
Calcium: 9.6 mg/dL (ref 8.9–10.3)
Chloride: 107 mmol/L (ref 98–111)
Creatinine, Ser: 1.04 mg/dL (ref 0.61–1.24)
GFR, Estimated: >60 mL/min (ref >60)
Glucose, Bld: 127 mg/dL — ABNORMAL HIGH (ref 70–99)
Potassium: 3.6 mmol/L (ref 3.5–5.1)
Sodium: 141 mmol/L (ref 135–145)
Total Bilirubin: 0.8 mg/dL (ref 0.3–1.2)
Total Protein: 8.6 g/dL — ABNORMAL HIGH (ref 6.5–8.1)

## 2020-11-26 LAB — DIFFERENTIAL
Abs Immature Granulocytes: 0.1 10*3/uL — ABNORMAL HIGH (ref 0.00–0.07)
Basophils Absolute: 0 10*3/uL (ref 0.0–0.1)
Basophils Relative: 0 %
Eosinophils Absolute: 0 10*3/uL (ref 0.0–0.5)
Eosinophils Relative: 0 %
Immature Granulocytes: 1 %
Lymphocytes Relative: 7 %
Lymphs Abs: 1.6 10*3/uL (ref 0.7–4.0)
Monocytes Absolute: 1.4 10*3/uL — ABNORMAL HIGH (ref 0.1–1.0)
Monocytes Relative: 6 %
Neutro Abs: 18.4 10*3/uL — ABNORMAL HIGH (ref 1.7–7.7)
Neutrophils Relative %: 86 %

## 2020-11-26 LAB — CBC
HCT: 46.2 % (ref 39.0–52.0)
Hemoglobin: 16.2 g/dL (ref 13.0–17.0)
MCH: 30.2 pg (ref 26.0–34.0)
MCHC: 35.1 g/dL (ref 30.0–36.0)
MCV: 86 fL (ref 80.0–100.0)
Platelets: 234 10*3/uL (ref 150–400)
RBC: 5.37 MIL/uL (ref 4.22–5.81)
RDW: 14.3 % (ref 11.5–15.5)
WBC: 21.7 10*3/uL — ABNORMAL HIGH (ref 4.0–10.5)
nRBC: 0 % (ref 0.0–0.2)

## 2020-11-26 LAB — TROPONIN I (HIGH SENSITIVITY): Troponin I (High Sensitivity): 3 ng/L (ref <18)

## 2020-11-26 LAB — LIPASE, BLOOD: Lipase: 27 U/L (ref 11–51)

## 2020-11-26 LAB — RESP PANEL BY RT-PCR (FLU A&B, COVID) ARPGX2
Influenza A by PCR: NEGATIVE
Influenza B by PCR: NEGATIVE
SARS Coronavirus 2 by RT PCR: NEGATIVE

## 2020-11-26 LAB — LACTIC ACID, PLASMA: Lactic Acid, Venous: 1.8 mmol/L (ref 0.5–1.9)

## 2020-11-26 MED ORDER — DICYCLOMINE HCL 10 MG PO CAPS
10.0000 mg | ORAL_CAPSULE | Freq: Once | ORAL | Status: AC
  Administered 2020-11-26: 10 mg via ORAL
  Filled 2020-11-26: qty 1

## 2020-11-26 MED ORDER — ONDANSETRON HCL 4 MG/2ML IJ SOLN
4.0000 mg | Freq: Once | INTRAMUSCULAR | Status: AC
  Administered 2020-11-26: 4 mg via INTRAVENOUS
  Filled 2020-11-26: qty 2

## 2020-11-26 MED ORDER — METOCLOPRAMIDE HCL 10 MG PO TABS: 10.0000 mg | ORAL_TABLET | Freq: Four times a day (QID) | ORAL | 0 refills | Status: DC

## 2020-11-26 MED ORDER — DICYCLOMINE HCL 20 MG PO TABS: 20.0000 mg | ORAL_TABLET | Freq: Two times a day (BID) | ORAL | 0 refills | Status: DC

## 2020-11-26 MED ORDER — SODIUM CHLORIDE 0.9 % IV BOLUS
1000.0000 mL | Freq: Once | INTRAVENOUS | Status: AC
  Administered 2020-11-26: 1000 mL via INTRAVENOUS

## 2020-11-26 MED ORDER — ACETAMINOPHEN 325 MG PO TABS
650.0000 mg | ORAL_TABLET | Freq: Once | ORAL | Status: AC
  Administered 2020-11-26: 650 mg via ORAL
  Filled 2020-11-26: qty 2

## 2020-11-26 MED ORDER — METOCLOPRAMIDE HCL 5 MG/ML IJ SOLN
10.0000 mg | Freq: Once | INTRAMUSCULAR | Status: AC
  Administered 2020-11-26: 10 mg via INTRAVENOUS
  Filled 2020-11-26: qty 2

## 2020-11-26 MED ORDER — IOHEXOL 300 MG/ML  SOLN: 100.0000 mL | Freq: Once | INTRAMUSCULAR | Status: DC | PRN

## 2020-11-26 NOTE — ED Notes (Signed)
Patient transported to CT via stretcher.

## 2020-11-26 NOTE — ED Notes (Signed)
Given PO food and fluids.

## 2020-11-26 NOTE — ED Provider Notes (Signed)
[facility] Provider Note   CSN: [id number] Arrival date & time: 11/26/20  7096     History Chief Complaint  Patient presents with  . Vomiting    Alejandro Flores is a 40 y.o. male with PMHx A fib (not anticoagulated) and pancreatitis who presents to the ED today via EMS with complaint of nausea and NBNB emesis for the past 2 days. Pt reports hx of pancreatitis in the past and states this feels similar however he denies drinking alcohol in over 3 years time. He also complains of 2 episodes of diarrhea. He mentions very mild diffuse abdominal pain however worse in the RUQ. He also mentions that he sneezed yesterday and then felt a pressure/"bubbling" sensation in his chest that shortly afterward went away on its own. He has not felt that same sensation with coughing or vomiting. Pt reports that his throat feels like it is burned and he feels improvement when he whispers instead of using full volume. He denies fevers or chills. No other complaints at this time. No previous abdominal surgeries.   The history is provided by the patient and medical records.       Past Medical History:  Diagnosis Date  . Anxiety   . Atrial fibrillation ([place])    Appears associated with alcohol abuse and withdrawal  . Depression    Has a counselor at [employer].  . ETOH abuse 2013   Relates to separation/divorce   . Gout 2011-2012  . 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 Pancreatic pseudocyst/cyst    Spring of 2015 per patient- [facility] at [facility]    Patient Active Problem List   Diagnosis Date Noted  . Alcoholism ([place]) 08/06/2015  . Paroxysmal atrial fibrillation ([place]) 08/06/2015  . Anxiety disorder 08/06/2015  . Panic disorder 08/06/2015  . Depression 08/06/2015  . Pancreatitis 08/06/2015  . Gout 08/06/2015  . Kidney stones 08/06/2015  . Tourette's 08/06/2015    Past Surgical History:  Procedure Laterality Date  . colonoscopy with polypectomy  2016   [facility]--had rectal bleeding and one was precancerous       Family History  Problem Relation Age of Onset  . Breast cancer Mother 50  . Hyperlipidemia Mother   . Anxiety disorder Mother   . Alcohol abuse Father   . Heart disease Father 24       Triple bypass   . COPD Father        chronic smoker  . Alcohol abuse Brother   . Depression Brother     Social History   Tobacco Use  . Smoking status: Current Every Day Smoker    Packs/day: 0.50    Years: 4.00    Pack years: 2.00    Types: Cigarettes  . Smokeless tobacco: Never Used  Substance Use Topics  . Alcohol use: No    Alcohol/week: 0.0 standard drinks    Comment: Close to 200 days ago with treatment at [facility] and [facility] and alcohol free.  Going 2-3 times to AA weekly  . Drug use: No    Types: Marijuana    Comment: MJ in college    Home Medications Prior to Admission medications   Medication Sig Start Date End Date Taking? Authorizing Provider  dicyclomine (BENTYL) 20 MG tablet Take 1 tablet (20 mg total) by mouth 2 (two) times daily. 11/26/20  Yes Redding Cloe, PA-C  metoCLOPramide (REGLAN) 10 MG tablet Take 1 tablet (10 mg total) by mouth every  6 (six) hours. 11/26/20  Yes Hiroshi Krummel, PA-C  acetaminophen (TYLENOL) 325 MG tablet Take 2 tablets (650 mg total) by mouth every 6 (six) hours as needed. Patient not taking: Reported on 01/08/2016 06/15/15   Macarthur Critchley, MD  amoxicillin-clavulanate (AUGMENTIN) 875-125 MG tablet Take 1 tablet by mouth every 12 (twelve) hours. 05/17/16   Ward, Ozella Almond, PA-C  benzonatate (TESSALON) 100 MG capsule Take 1 capsule (100 mg total) by mouth 3 (three) times daily as needed for cough. 05/17/16   Ward, Ozella Almond, PA-C  clonazePAM Bobbye [place]) 1 MG tablet 1/2 tab by mouth twice daily as needed for anxiety 08/15/16   Mack Hook, MD  diclofenac (VOLTAREN) 75 MG EC tablet Take 1 tablet (75 mg total) by mouth 2 (two) times daily. 10/15/15    Mack Hook, MD  diltiazem (CARDIZEM CD) 120 MG 24 hr capsule 1 tab once daily by mouth 08/06/15   Mack Hook, MD  gabapentin (NEURONTIN) 300 MG capsule Take 1 capsule (300 mg total) by mouth 3 (three) times daily. 08/22/16   Mack Hook, MD  HYDROcodone-acetaminophen (NORCO/VICODIN) 5-325 MG tablet Take 1 tablet by mouth every 6 (six) hours as needed for severe pain. 01/08/16   Mack Hook, MD  penicillin v potassium (VEETID) 500 MG tablet Take 1 tablet (500 mg total) by mouth 4 (four) times daily. 01/08/16   Mack Hook, MD  predniSONE (DELTASONE) 20 MG tablet Take 2 tablets (40 mg total) by mouth daily with breakfast. For the next four days 04/23/16   Carmin Muskrat, MD  QUEtiapine Fumarate (SEROQUEL XR) 150 MG 24 hr tablet Take 200 mg by mouth at bedtime. Reported on 10/15/2015    [provider]  sertraline (ZOLOFT) 100 MG tablet Take 100 mg by mouth daily.    [provider]    Allergies    Morphine  Review of Systems   Review of Systems  Constitutional: Negative for chills and fever.  Respiratory: Negative for shortness of breath.   Cardiovascular: Positive for chest pain (resolved).  Gastrointestinal: Positive for abdominal pain, diarrhea, nausea and vomiting.  All other systems reviewed and are negative.   Physical Exam Updated Vital Signs BP (!) 149/95 (BP Location: Left Arm)   Pulse (!) 111   Temp 98.9 F (37.2 C) (Oral)   Resp (!) 22   Ht 5\' 10"  (1.778 m)   Wt 113.4 kg   SpO2 99%   BMI 35.87 kg/m   Physical Exam Vitals and nursing note reviewed.  Constitutional:      Appearance: He is obese. He is not ill-appearing or diaphoretic.  HENT:     Head: Normocephalic and atraumatic.     Mouth/Throat:     Mouth: Mucous membranes are dry.  Eyes:     Conjunctiva/sclera: Conjunctivae normal.  Cardiovascular:     Rate and Rhythm: Regular rhythm. Tachycardia present.  Pulmonary:     Effort: Pulmonary effort is  normal.     Breath sounds: Normal breath sounds. No wheezing, rhonchi or rales.  Abdominal:     Palpations: Abdomen is soft.     Tenderness: There is abdominal tenderness. There is no right CVA tenderness, left CVA tenderness, guarding or rebound.     Comments: Soft, mild RUQ TTP, +BS throughout, no r/g/r, neg mcburney's, no CVA TTP  Musculoskeletal:     Cervical back: Neck supple.  Skin:    General: Skin is warm and dry.  Neurological:     Mental Status: He is alert.  ED Results / Procedures / Treatments   Labs (all labs ordered are listed, but only abnormal results are displayed) Labs Reviewed  COMPREHENSIVE METABOLIC PANEL - Abnormal; Notable for the following components:      Result Value   CO2 19 (*)    Glucose, Bld 127 (*)    Total Protein 8.6 (*)    Albumin 5.1 (*)    All other components within normal limits  CBC - Abnormal; Notable for the following components:   WBC 21.7 (*)    All other components within normal limits  URINALYSIS, ROUTINE W REFLEX MICROSCOPIC - Abnormal; Notable for the following components:   APPearance HAZY (*)    Hgb urine dipstick SMALL (*)    Ketones, ur 5 (*)    Protein, ur 100 (*)    All other components within normal limits  DIFFERENTIAL - Abnormal; Notable for the following components:   Neutro Abs 18.4 (*)    Monocytes Absolute 1.4 (*)    Abs Immature Granulocytes 0.10 (*)    All other components within normal limits  RESP PANEL BY RT-PCR (FLU A&B, COVID) ARPGX2  LIPASE, BLOOD  LACTIC ACID, PLASMA  TROPONIN I (HIGH SENSITIVITY)    EKG EKG Interpretation  Date/Time:  Monday November 26 2020 06:47:46 EDT Ventricular Rate:  80 PR Interval:  150 QRS Duration: 110 QT Interval:  402 QTC Calculation: 464 R Axis:   69 Text Interpretation: Sinus rhythm RSR' in V1 or V2, right VCD or RVH Confirmed by Randal Buba, April ([id number]) on 11/26/2020 6:49:57 AM   Radiology CT Abdomen Pelvis Wo Contrast  Result Date: 11/26/2020 CLINICAL DATA:   Abdominal pain, fever.  History of pancreatitis EXAM: CT ABDOMEN AND PELVIS WITHOUT CONTRAST TECHNIQUE: Multidetector CT imaging of the abdomen and pelvis was performed following the standard protocol without IV contrast. COMPARISON:  05/14/2015 FINDINGS: Lower chest: Lung bases are clear. No effusions. Heart is normal size. Hepatobiliary: No focal hepatic abnormality. Gallbladder unremarkable. Pancreas: No focal abnormality or ductal dilatation. No peripancreatic inflammation. Spleen: No focal abnormality.  Normal size. Adrenals/Urinary Tract: No adrenal abnormality. No focal renal abnormality. No stones or hydronephrosis. Urinary bladder is unremarkable. Stomach/Bowel: Normal appendix. Stomach, large and small bowel grossly unremarkable. Vascular/Lymphatic: No evidence of aneurysm or adenopathy. Reproductive: No visible focal abnormality. Other: No free fluid or free air. Musculoskeletal: No acute bony abnormality. IMPRESSION: No CT evidence for pancreatitis. No acute findings in the abdomen or pelvis. Electronically Signed   By: Rolm Baptise M.D.   On: 11/26/2020 09:30   DG Neck Soft Tissue  Result Date: 11/26/2020 CLINICAL DATA:  Sore throat and hoarseness EXAM: NECK SOFT TISSUES - 1+ VIEW COMPARISON:  None. FINDINGS: Frontal and lateral views were obtained. The epiglottis and aryepiglottic folds appear normal. Prevertebral soft tissues are normal. No air-fluid level to suggest abscess. Tongue and tongue base regions appear normal. Visualized trachea appears unremarkable. Visualized upper lung regions are clear. No pneumomediastinum evident in the upper chest and neck. There is elevation of the right C7 transverse process. IMPRESSION: No pneumomediastinum evident. Epiglottis and aryepiglottic folds appear normal. Other soft tissues appear unremarkable. No bony abnormality appreciable. Electronically Signed   By: Lowella Grip III M.D.   On: 11/26/2020 07:57   DG Chest 2 View  Result Date:  11/26/2020 CLINICAL DATA:  Cough and hoarseness EXAM: CHEST - 2 VIEW COMPARISON:  April 23, 2016 FINDINGS: Lungs are clear. Heart size and pulmonary vascularity are normal. No adenopathy. No pneumothorax. No bone lesions. IMPRESSION:  Lungs clear.  Cardiac silhouette normal. Electronically Signed   By: Lowella Grip III M.D.   On: 11/26/2020 07:57   US Abdomen Limited RUQ (LIVER/GB)  Result Date: 11/26/2020 CLINICAL DATA:  40 year old male with nausea and vomiting for 2 days. EXAM: ULTRASOUND ABDOMEN LIMITED RIGHT UPPER QUADRANT COMPARISON:  CT Abdomen and Pelvis 05/14/2015. FINDINGS: Gallbladder: No gallstones or wall thickening visualized. No sonographic Murphy sign noted by sonographer. Common bile duct: Diameter: 2 mm, normal. Liver: Echogenic liver (image 29). No discrete liver lesion or intrahepatic biliary ductal dilatation. Portal vein is patent on color Doppler imaging with normal direction of blood flow towards the liver. Other: Negative visible right kidney. IMPRESSION: 1. Chronic hepatic steatosis. 2. Negative gallbladder and no evidence of bile duct obstruction. Electronically Signed   By: Genevie Ann M.D.   On: 11/26/2020 07:39    Procedures Procedures   Medications Ordered in ED Medications  iohexol (OMNIPAQUE) 300 MG/ML solution 100 mL (has no administration in time range)  sodium chloride 0.9 % bolus 1,000 mL (0 mLs Intravenous Stopped 11/26/20 0958)  ondansetron (ZOFRAN) injection 4 mg (4 mg Intravenous Given 11/26/20 0652)  acetaminophen (TYLENOL) tablet 650 mg (650 mg Oral Given 11/26/20 0747)  metoCLOPramide (REGLAN) injection 10 mg (10 mg Intravenous Given 11/26/20 1018)  dicyclomine (BENTYL) capsule 10 mg (10 mg Oral Given 11/26/20 1132)    ED Course  I have reviewed the triage vital signs and the nursing notes.  Pertinent labs & imaging results that were available during my care of the patient were reviewed by me and considered in my medical decision making (see chart for  details).  Clinical Course as of 11/26/20 1228  Mon Nov 26, 2020  0716 WBC(!): 21.7 [MV]  0730 Abs Immature Granulocytes(!): 0.10 [MV]  0730 Temp: 100.1 F (37.8 C) [MV]  0830 Lactic Acid, Venous: 1.8 [MV]    Clinical Course User Index [MV] [name], PA-C   MDM Rules/Calculators/A&P                          40 year old male who presents to the ED today with complaint of nausea, vomiting, abdominal pain, diarrhea for the past 2 days.  Endorses history of pancreatitis and states this feels similar despite not drinking alcohol for 3 years per patient.  He also describes a vague "bubbling" sensation in his chest yesterday post sneezing.  On arrival to the ED today patient is afebrile.  He is tachycardic in the 110s as well as tachypneic with respirations of 22 however this seems to have resolved while in the room with normal sinus rhythm.  Patient does have a history of A. fib, is on diltiazem for same.  States he is unsure if he has been able to keep his diltiazem down over the past 2 days.  He is not anticoagulated due to previous history of frequent nosebleeds.  On my exam he has very mild right upper quadrant tenderness palpation, negative Murphy's.  He has dry mucous membranes as well.  We will plan for fluids, antiemetics, lab work including troponin given vague sensation of bubbling in his chest.  We will plan for soft tissue neck x-ray as well as chest x-ray given patient is whispering on exam and states that he has some mild throat pain, question Boerhaave's.  Also plan for right upper quadrant ultrasound to assess for any gallbladder etiology at this time.   EKG with NSR. No acute ischemic changes.  CBC  with a leukocytosis of 21,700. Will add on differential at this time.   Repeat vitals with an oral temp 100.1. Tylenol provided. Will add on lactic acid at this time as well as swab for COVID.   U/A with small hgb on dipstick however no RBC per HPF. No signs of infection.  CMP  with glucose 127, bicarb 19. No gap. LFTs unremarkable.  Lipase WNL at 27.  Troponin of 3  Ultrasound: IMPRESSION:  1. Chronic hepatic steatosis.  2. Negative gallbladder and no evidence of bile duct obstruction.   CXR and soft tissue neck xray unremarkable without signs of pneumomediastinum.  Lactic acid 1.8  Will proceed with CT A/P without contrast at this time for further eval of abdominal pain and fevers with an elevated WBC count.   CT: IMPRESSION:  No CT evidence for pancreatitis.    No acute findings in the abdomen or pelvis.   COVID and flu negative Workup overall reassuring. Question viral gastroenteritis causing symptoms with elevated WBC s/2 dehydration? Will plan to fluid challenge at this time and if able to tolerate PO will plan to discharge home.   On reevaluation pt reports he vomited again after fluid challenge and also had a small BM that he was unable to control while vomiting. Will plan for additional antiemetics and reeval however if vomiting unable to be controlled pt will require admission.   After additional antiemetics and bentyl pt able to tolerate PO with small sips of water and crackers. Will discharge home at this time. Given immunocompetent and symptoms only for 2 days do not feel he requires abx at this time. Will discharge with PO reglan and bentyl for symptomatic relief and close PCP follow up. Pt instructed on strict return precautions. He is in agreement with plan and stable for discharge.   This note was prepared using Dragon voice recognition software and may include unintentional dictation errors due to the inherent limitations of voice recognition software.  Final Clinical Impression(s) / ED Diagnoses Final diagnoses:  RUQ abdominal pain  Gastroenteritis    Rx / DC Orders ED Discharge Orders         Ordered    metoCLOPramide (REGLAN) 10 MG tablet  Every 6 hours        11/26/20 1227    dicyclomine (BENTYL) 20 MG tablet  2 times daily         11/26/20 1227           Discharge Instructions     Please pick up medications and take as prescribed  Follow up with your PCP by the end of the week for further evaluation  Return to the ED for any new/worsening symptoms including inability to tolerate fluids/solids at home, symptoms that last > 1 week, worsening abdominal pain, passing out, or any other concerning symptoms       [name], PA-C 11/26/20 1229    Fredia Sorrow, MD 11/27/20 [phone]

## 2020-11-26 NOTE — ED Triage Notes (Signed)
Pt BIB [facility] from home complaining of N/V that started 2 days ago. States that he is unable to tolerate water. Reports a hx of pancreatitis and a fib. States that it feels like a pancreatitis flare. Endorses abdominal pain.

## 2020-11-26 NOTE — Discharge Instructions (Addendum)
Please pick up medications and take as prescribed  Follow up with your PCP by the end of the week for further evaluation  Return to the ED for any new/worsening symptoms including inability to tolerate fluids/solids at home, symptoms that last > 1 week, worsening abdominal pain, passing out, or any other concerning symptoms

## 2020-11-28 ENCOUNTER — Encounter (HOSPITAL_COMMUNITY): Payer: Self-pay

## 2020-11-28 ENCOUNTER — Emergency Department (HOSPITAL_COMMUNITY)
Admission: EM | Admit: 2020-11-28 | Discharge: 2020-11-28 | Disposition: A | Discharge status: Home / Self Care | Payer: PRIVATE HEALTH INSURANCE | Source: Home / Self Care | Attending: Emergency Medicine | Admitting: Emergency Medicine

## 2020-11-28 DIAGNOSIS — F17210 Nicotine dependence, cigarettes, uncomplicated: Secondary | ICD-10-CM | POA: Insufficient documentation

## 2020-11-28 DIAGNOSIS — K2211 Ulcer of esophagus with bleeding: Secondary | ICD-10-CM | POA: Diagnosis not present

## 2020-11-28 DIAGNOSIS — R1084 Generalized abdominal pain: Secondary | ICD-10-CM | POA: Diagnosis not present

## 2020-11-28 DIAGNOSIS — R112 Nausea with vomiting, unspecified: Secondary | ICD-10-CM | POA: Insufficient documentation

## 2020-11-28 DIAGNOSIS — R1011 Right upper quadrant pain: Secondary | ICD-10-CM

## 2020-11-28 DIAGNOSIS — R197 Diarrhea, unspecified: Secondary | ICD-10-CM | POA: Insufficient documentation

## 2020-11-28 LAB — URINALYSIS, ROUTINE W REFLEX MICROSCOPIC
Bilirubin Urine: NEGATIVE
Glucose, UA: NEGATIVE mg/dL
Ketones, ur: NEGATIVE mg/dL
Leukocytes,Ua: NEGATIVE
Nitrite: NEGATIVE
Protein, ur: NEGATIVE mg/dL
Specific Gravity, Urine: 1.012 (ref 1.005–1.030)
pH: 7 (ref 5.0–8.0)

## 2020-11-28 LAB — COMPREHENSIVE METABOLIC PANEL
ALT: 29 U/L (ref 0–44)
AST: 23 U/L (ref 15–41)
Albumin: 4.4 g/dL (ref 3.5–5.0)
Alkaline Phosphatase: 64 U/L (ref 38–126)
Anion gap: 9 (ref 5–15)
BUN: 11 mg/dL (ref 6–20)
CO2: 25 mmol/L (ref 22–32)
Calcium: 8.8 mg/dL — ABNORMAL LOW (ref 8.9–10.3)
Chloride: 103 mmol/L (ref 98–111)
Creatinine, Ser: 0.76 mg/dL (ref 0.61–1.24)
GFR, Estimated: >60 mL/min (ref >60)
Glucose, Bld: 107 mg/dL — ABNORMAL HIGH (ref 70–99)
Potassium: 3 mmol/L — ABNORMAL LOW (ref 3.5–5.1)
Sodium: 137 mmol/L (ref 135–145)
Total Bilirubin: 1 mg/dL (ref 0.3–1.2)
Total Protein: 7.6 g/dL (ref 6.5–8.1)

## 2020-11-28 LAB — CBC WITH DIFFERENTIAL/PLATELET
Abs Immature Granulocytes: 0.06 10*3/uL (ref 0.00–0.07)
Basophils Absolute: 0 10*3/uL (ref 0.0–0.1)
Basophils Relative: 0 %
Eosinophils Absolute: 0 10*3/uL (ref 0.0–0.5)
Eosinophils Relative: 0 %
HCT: 44.8 % (ref 39.0–52.0)
Hemoglobin: 15.8 g/dL (ref 13.0–17.0)
Immature Granulocytes: 1 %
Lymphocytes Relative: 22 %
Lymphs Abs: 2.7 10*3/uL (ref 0.7–4.0)
MCH: 29.6 pg (ref 26.0–34.0)
MCHC: 35.3 g/dL (ref 30.0–36.0)
MCV: 84.1 fL (ref 80.0–100.0)
Monocytes Absolute: 1.3 10*3/uL — ABNORMAL HIGH (ref 0.1–1.0)
Monocytes Relative: 10 %
Neutro Abs: 8.6 10*3/uL — ABNORMAL HIGH (ref 1.7–7.7)
Neutrophils Relative %: 67 %
Platelets: 245 10*3/uL (ref 150–400)
RBC: 5.33 MIL/uL (ref 4.22–5.81)
RDW: 14 % (ref 11.5–15.5)
WBC: 12.7 10*3/uL — ABNORMAL HIGH (ref 4.0–10.5)
nRBC: 0 % (ref 0.0–0.2)

## 2020-11-28 LAB — LIPASE, BLOOD: Lipase: 31 U/L (ref 11–51)

## 2020-11-28 MED ORDER — ONDANSETRON HCL 4 MG/2ML IJ SOLN
4.0000 mg | Freq: Once | INTRAMUSCULAR | Status: AC
  Administered 2020-11-28: 4 mg via INTRAVENOUS
  Filled 2020-11-28: qty 2

## 2020-11-28 MED ORDER — POTASSIUM CHLORIDE CRYS ER 20 MEQ PO TBCR
40.0000 meq | EXTENDED_RELEASE_TABLET | Freq: Once | ORAL | Status: AC
  Administered 2020-11-28: 40 meq via ORAL
  Filled 2020-11-28: qty 2

## 2020-11-28 MED ORDER — ONDANSETRON 4 MG PO TBDP: 4.0000 mg | ORAL_TABLET | Freq: Three times a day (TID) | ORAL | 0 refills | Status: DC | PRN

## 2020-11-28 MED ORDER — POTASSIUM CHLORIDE ER 10 MEQ PO TBCR: 10.0000 meq | EXTENDED_RELEASE_TABLET | Freq: Every day | ORAL | 0 refills | Status: DC

## 2020-11-28 MED ORDER — SODIUM CHLORIDE 0.9 % IV BOLUS
1000.0000 mL | Freq: Once | INTRAVENOUS | Status: AC
  Administered 2020-11-28: 1000 mL via INTRAVENOUS

## 2020-11-28 NOTE — ED Provider Notes (Signed)
[facility] DEPT Provider Note   CSN: [id number] Arrival date & time: 11/28/20  6606     History Chief Complaint  Patient presents with  . Emesis  . Abdominal Pain    Alejandro Flores is a 40 y.o. male.  40 year old male returns to the emergency room via EMS for ongoing right upper quadrant abdominal pain with nausea and vomiting x4 days.  Patient was seen here for same 2 days ago, told he likely had a stomach virus and was discharged home.  Patient states that he is not had any solid food for about 94 hours.  States that he is having to "dry swallow" his medications (described as swallowing with him and his saliva, if he has a small sip of Gatorade with his pills he will vomit).  States that his stools are loose, bluelight his Gatorade, occasionally has a yellow oil consistency floating on top of the water.        Past Medical History:  Diagnosis Date  . Anxiety   . Atrial fibrillation ([place])    Appears associated with alcohol abuse and withdrawal  . Depression    Has a counselor at [employer].  . ETOH abuse 2013   Relates to separation/divorce   . Gout 2011-2012  . 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 Pancreatic pseudocyst/cyst    Spring of 2015 per patient- [facility] at [facility]    Patient Active Problem List   Diagnosis Date Noted  . Alcoholism ([place]) 08/06/2015  . Paroxysmal atrial fibrillation ([place]) 08/06/2015  . Anxiety disorder 08/06/2015  . Panic disorder 08/06/2015  . Depression 08/06/2015  . Pancreatitis 08/06/2015  . Gout 08/06/2015  . Kidney stones 08/06/2015  . Tourette's 08/06/2015    Past Surgical History:  Procedure Laterality Date  . colonoscopy with polypectomy  2016   [facility]--had rectal bleeding and one was precancerous       Family History  Problem Relation Age of Onset  . Breast cancer Mother 43  . Hyperlipidemia Mother   . Anxiety disorder Mother   . Alcohol abuse Father   . Heart disease  Father 65       Triple bypass   . COPD Father        chronic smoker  . Alcohol abuse Brother   . Depression Brother     Social History   Tobacco Use  . Smoking status: Current Every Day Smoker    Packs/day: 0.50    Years: 4.00    Pack years: 2.00    Types: Cigarettes  . Smokeless tobacco: Never Used  Substance Use Topics  . Alcohol use: No    Alcohol/week: 0.0 standard drinks    Comment: Close to 200 days ago with treatment at [facility] and [facility] and alcohol free.  Going 2-3 times to AA weekly  . Drug use: No    Types: Marijuana    Comment: MJ in college    Home Medications Prior to Admission medications   Medication Sig Start Date End Date Taking? Authorizing Provider  acetaminophen (TYLENOL) 500 MG tablet Take 1,000 mg by mouth every 6 (six) hours as needed for mild pain, fever or headache.   Yes [provider]  dicyclomine (BENTYL) 20 MG tablet Take 1 tablet (20 mg total) by mouth 2 (two) times daily. 11/26/20  Yes Alroy Bailiff, Margaux, PA-C  diltiazem (CARDIZEM CD) 120 MG 24 hr capsule 1 tab once daily by mouth 08/06/15  Yes Mack Hook, MD  LORazepam (ATIVAN) 2 MG tablet Take 0.5 mg by mouth every 6 (six) hours as needed for anxiety.   Yes [provider]  metoCLOPramide (REGLAN) 10 MG tablet Take 1 tablet (10 mg total) by mouth every 6 (six) hours. 11/26/20  Yes Venter, Margaux, PA-C  ondansetron (ZOFRAN ODT) 4 MG disintegrating tablet Take 1 tablet (4 mg total) by mouth every 8 (eight) hours as needed for nausea or vomiting. 11/28/20  Yes Tacy Learn, PA-C  potassium chloride (KLOR-CON) 10 MEQ tablet Take 1 tablet (10 mEq total) by mouth daily. 11/28/20  Yes Tacy Learn, PA-C  sertraline (ZOLOFT) 100 MG tablet Take 100 mg by mouth daily.   Yes [provider]    Allergies    Morphine  Review of Systems   Review of Systems  Constitutional: Negative for chills, diaphoresis and fever.  Respiratory: Negative for shortness of  breath.   Cardiovascular: Negative for chest pain.  Gastrointestinal: Positive for abdominal pain, diarrhea, nausea and vomiting. Negative for constipation.  Genitourinary: Negative for dysuria.  Musculoskeletal: Negative for arthralgias and myalgias.  Skin: Negative for rash and wound.  Allergic/Immunologic: Negative for immunocompromised state.  Neurological: Negative for weakness.  Hematological: Negative for adenopathy.  Psychiatric/Behavioral: Negative for confusion.  All other systems reviewed and are negative.   Physical Exam Updated Vital Signs BP 138/85   Pulse 61   Temp 99.5 F (37.5 C) (Oral)   Resp 19   Ht 5\' 10"  (1.778 m)   Wt 113.4 kg   SpO2 99%   BMI 35.87 kg/m   Physical Exam Vitals and nursing note reviewed.  Constitutional:      General: He is not in acute distress.    Appearance: He is well-developed. He is not diaphoretic.  HENT:     Head: Normocephalic and atraumatic.  Cardiovascular:     Rate and Rhythm: Normal rate and regular rhythm.     Heart sounds: Normal heart sounds.  Pulmonary:     Effort: Pulmonary effort is normal.     Breath sounds: Normal breath sounds.  Abdominal:     General: Bowel sounds are normal.     Palpations: Abdomen is soft.     Tenderness: There is abdominal tenderness in the right upper quadrant. There is no right CVA tenderness or left CVA tenderness.  Skin:    General: Skin is warm and dry.     Findings: No erythema or rash.  Neurological:     Mental Status: He is alert and oriented to person, place, and time.  Psychiatric:        Behavior: Behavior normal.     ED Results / Procedures / Treatments   Labs (all labs ordered are listed, but only abnormal results are displayed) Labs Reviewed  CBC WITH DIFFERENTIAL/PLATELET - Abnormal; Notable for the following components:      Result Value   WBC 12.7 (*)    Neutro Abs 8.6 (*)    Monocytes Absolute 1.3 (*)    All other components within normal limits   COMPREHENSIVE METABOLIC PANEL - Abnormal; Notable for the following components:   Potassium 3.0 (*)    Glucose, Bld 107 (*)    Calcium 8.8 (*)    All other components within normal limits  URINALYSIS, ROUTINE W REFLEX MICROSCOPIC - Abnormal; Notable for the following components:   Hgb urine dipstick SMALL (*)    Bacteria, UA RARE (*)    All other components within normal limits  GASTROINTESTINAL PANEL BY  PCR, STOOL (REPLACES STOOL CULTURE)  C DIFFICILE QUICK SCREEN W PCR REFLEX  LIPASE, BLOOD    EKG None  Radiology No results found.  Procedures Procedures   Medications Ordered in ED Medications  sodium chloride 0.9 % bolus 1,000 mL (0 mLs Intravenous Stopped 11/28/20 1147)  ondansetron (ZOFRAN) injection 4 mg (4 mg Intravenous Given 11/28/20 1035)  potassium chloride SA (KLOR-CON) CR tablet 40 mEq (40 mEq Oral Given 11/28/20 1248)    ED Course  I have reviewed the triage vital signs and the nursing notes.  Pertinent labs & imaging results that were available during my care of the patient were reviewed by me and considered in my medical decision making (see chart for details).  Clinical Course as of 11/28/20 1325  Wed Nov 28, 7265  6358 40 year old male with ongoing right upper quadrant abdominal pain, worse with swallowing his pills or trying to eat or drink.  States unable to eat for several days now, tolerates very small sips of liquid otherwise as severe pain or vomiting. Patient was seen in the ER 2 days ago for same, found to have fatty liver, otherwise imaging was unremarkable. On exam, has tenderness of right upper quadrant.  Labs reviewed and compared to labs from prior ER visit.  Patient's white blood cell count is elevated at 12.7 however it is improved compared to prior.  CMP with mild hypokalemia with potassium of 3.0, given oral potassium and will discharge with a few more days.  Urinalysis with small hemoglobin and rare bacteria without urinary symptoms.  Lipase  within normal meds at 31. Patient is tolerating p.o. medications and fluid albeit small sips at a time.  He was given IV fluids.  GI panel and C. difficile testing have just not been collected, has not had to have a bowel movement for the past 4 hours while in the ER.  Recommend patient follow-up with his GI provider.  Given prescription for Zofran as this has helped in the ER today. Discussed with patient, need for further work-up however this is testing done through GI and not through the emergency room. [LM]    Clinical Course User Index [LM] Roque Lias   MDM Rules/Calculators/A&P                          Final Clinical Impression(s) / ED Diagnoses Final diagnoses:  Nausea vomiting and diarrhea  Right upper quadrant abdominal pain    Rx / DC Orders ED Discharge Orders         Ordered    ondansetron (ZOFRAN ODT) 4 MG disintegrating tablet  Every 8 hours PRN        11/28/20 1316    potassium chloride (KLOR-CON) 10 MEQ tablet  Daily        11/28/20 1319           Tacy Learn, PA-C 11/28/20 1325    Valarie Merino, MD 11/29/20 [id number]

## 2020-11-28 NOTE — Discharge Instructions (Addendum)
Follow up with your GI provider. Take Zofran as needed as prescribed for nausea and vomiting. Take potassium supplement daily as prescribed, start tomorrow.

## 2020-11-28 NOTE — ED Triage Notes (Signed)
Pt BIB [facility] from home for N/V from pancreatitis flare going on 4 days. Dizziness with ambulation. Pain RUQ, no rebound tenderness, generalized diffuse pain. Minimal PO intake, very low output. Endorses oily, coffee-ground looking stool.this morning. 20ga RF, 557mL NS en route, 4mg  zofran.  BP 145/89 HR 77 RR 18 CBG 118

## 2020-11-28 NOTE — ED Triage Notes (Signed)
Pt states he took 1g tylenol and 0.5 mg ativan at 0745.

## 2020-11-29 ENCOUNTER — Emergency Department (HOSPITAL_COMMUNITY): Payer: PRIVATE HEALTH INSURANCE

## 2020-11-29 ENCOUNTER — Other Ambulatory Visit: Payer: Self-pay

## 2020-11-29 ENCOUNTER — Inpatient Hospital Stay (HOSPITAL_COMMUNITY)
Admission: EM | Admit: 2020-11-29 | Discharge: 2020-12-04 | DRG: 381 | Disposition: A | Discharge status: Home / Self Care | Payer: PRIVATE HEALTH INSURANCE | Attending: Family Medicine | Admitting: Family Medicine

## 2020-11-29 DIAGNOSIS — F17200 Nicotine dependence, unspecified, uncomplicated: Secondary | ICD-10-CM | POA: Diagnosis present

## 2020-11-29 DIAGNOSIS — F4000 Agoraphobia, unspecified: Secondary | ICD-10-CM | POA: Diagnosis present

## 2020-11-29 DIAGNOSIS — I2510 Atherosclerotic heart disease of native coronary artery without angina pectoris: Secondary | ICD-10-CM | POA: Diagnosis present

## 2020-11-29 DIAGNOSIS — I471 Supraventricular tachycardia: Secondary | ICD-10-CM | POA: Diagnosis not present

## 2020-11-29 DIAGNOSIS — R9431 Abnormal electrocardiogram [ECG] [EKG]: Secondary | ICD-10-CM

## 2020-11-29 DIAGNOSIS — Z79899 Other long term (current) drug therapy: Secondary | ICD-10-CM

## 2020-11-29 DIAGNOSIS — F459 Somatoform disorder, unspecified: Secondary | ICD-10-CM | POA: Diagnosis present

## 2020-11-29 DIAGNOSIS — F32A Depression, unspecified: Secondary | ICD-10-CM | POA: Diagnosis present

## 2020-11-29 DIAGNOSIS — Z811 Family history of alcohol abuse and dependence: Secondary | ICD-10-CM

## 2020-11-29 DIAGNOSIS — Z818 Family history of other mental and behavioral disorders: Secondary | ICD-10-CM

## 2020-11-29 DIAGNOSIS — K580 Irritable bowel syndrome with diarrhea: Secondary | ICD-10-CM | POA: Diagnosis present

## 2020-11-29 DIAGNOSIS — F1210 Cannabis abuse, uncomplicated: Secondary | ICD-10-CM | POA: Diagnosis present

## 2020-11-29 DIAGNOSIS — F1021 Alcohol dependence, in remission: Secondary | ICD-10-CM | POA: Diagnosis present

## 2020-11-29 DIAGNOSIS — Z885 Allergy status to narcotic agent status: Secondary | ICD-10-CM

## 2020-11-29 DIAGNOSIS — R197 Diarrhea, unspecified: Secondary | ICD-10-CM

## 2020-11-29 DIAGNOSIS — R112 Nausea with vomiting, unspecified: Secondary | ICD-10-CM | POA: Diagnosis present

## 2020-11-29 DIAGNOSIS — F411 Generalized anxiety disorder: Secondary | ICD-10-CM | POA: Diagnosis present

## 2020-11-29 DIAGNOSIS — E876 Hypokalemia: Secondary | ICD-10-CM | POA: Diagnosis present

## 2020-11-29 DIAGNOSIS — F952 Tourette's disorder: Secondary | ICD-10-CM | POA: Diagnosis present

## 2020-11-29 DIAGNOSIS — K2211 Ulcer of esophagus with bleeding: Principal | ICD-10-CM | POA: Diagnosis present

## 2020-11-29 DIAGNOSIS — I480 Paroxysmal atrial fibrillation: Secondary | ICD-10-CM | POA: Diagnosis present

## 2020-11-29 DIAGNOSIS — M109 Gout, unspecified: Secondary | ICD-10-CM | POA: Diagnosis present

## 2020-11-29 DIAGNOSIS — Z6835 Body mass index (BMI) 35.0-35.9, adult: Secondary | ICD-10-CM

## 2020-11-29 DIAGNOSIS — Z7151 Drug abuse counseling and surveillance of drug abuser: Secondary | ICD-10-CM

## 2020-11-29 DIAGNOSIS — K760 Fatty (change of) liver, not elsewhere classified: Secondary | ICD-10-CM | POA: Diagnosis present

## 2020-11-29 DIAGNOSIS — Z716 Tobacco abuse counseling: Secondary | ICD-10-CM

## 2020-11-29 DIAGNOSIS — K920 Hematemesis: Secondary | ICD-10-CM

## 2020-11-29 DIAGNOSIS — F419 Anxiety disorder, unspecified: Secondary | ICD-10-CM | POA: Diagnosis present

## 2020-11-29 DIAGNOSIS — R1084 Generalized abdominal pain: Secondary | ICD-10-CM

## 2020-11-29 DIAGNOSIS — Z20822 Contact with and (suspected) exposure to covid-19: Secondary | ICD-10-CM | POA: Diagnosis present

## 2020-11-29 DIAGNOSIS — E6609 Other obesity due to excess calories: Secondary | ICD-10-CM | POA: Diagnosis present

## 2020-11-29 DIAGNOSIS — F418 Other specified anxiety disorders: Secondary | ICD-10-CM | POA: Diagnosis present

## 2020-11-29 DIAGNOSIS — F410 Panic disorder [episodic paroxysmal anxiety] without agoraphobia: Secondary | ICD-10-CM | POA: Diagnosis present

## 2020-11-29 DIAGNOSIS — R079 Chest pain, unspecified: Secondary | ICD-10-CM

## 2020-11-29 DIAGNOSIS — Z713 Dietary counseling and surveillance: Secondary | ICD-10-CM

## 2020-11-29 DIAGNOSIS — F17210 Nicotine dependence, cigarettes, uncomplicated: Secondary | ICD-10-CM | POA: Diagnosis present

## 2020-11-29 DIAGNOSIS — K319 Disease of stomach and duodenum, unspecified: Secondary | ICD-10-CM | POA: Diagnosis present

## 2020-11-29 DIAGNOSIS — R1013 Epigastric pain: Secondary | ICD-10-CM

## 2020-11-29 DIAGNOSIS — F1211 Cannabis abuse, in remission: Secondary | ICD-10-CM | POA: Diagnosis present

## 2020-11-29 LAB — URINALYSIS, ROUTINE W REFLEX MICROSCOPIC
Bacteria, UA: NONE SEEN
Bilirubin Urine: NEGATIVE
Glucose, UA: NEGATIVE mg/dL
Ketones, ur: 80 mg/dL — AB
Leukocytes,Ua: NEGATIVE
Nitrite: NEGATIVE
Protein, ur: 30 mg/dL — AB
Specific Gravity, Urine: 1.024 (ref 1.005–1.030)
pH: 5 (ref 5.0–8.0)

## 2020-11-29 LAB — COMPREHENSIVE METABOLIC PANEL
ALT: 29 U/L (ref 0–44)
AST: 30 U/L (ref 15–41)
Albumin: 4.3 g/dL (ref 3.5–5.0)
Alkaline Phosphatase: 60 U/L (ref 38–126)
Anion gap: 13 (ref 5–15)
BUN: 11 mg/dL (ref 6–20)
CO2: 21 mmol/L — ABNORMAL LOW (ref 22–32)
Calcium: 8.9 mg/dL (ref 8.9–10.3)
Chloride: 103 mmol/L (ref 98–111)
Creatinine, Ser: 0.89 mg/dL (ref 0.61–1.24)
GFR, Estimated: >60 mL/min (ref >60)
Glucose, Bld: 105 mg/dL — ABNORMAL HIGH (ref 70–99)
Potassium: 3.5 mmol/L (ref 3.5–5.1)
Sodium: 137 mmol/L (ref 135–145)
Total Bilirubin: 2 mg/dL — ABNORMAL HIGH (ref 0.3–1.2)
Total Protein: 7.3 g/dL (ref 6.5–8.1)

## 2020-11-29 LAB — CBC
HCT: 45 % (ref 39.0–52.0)
Hemoglobin: 15.6 g/dL (ref 13.0–17.0)
MCH: 29.5 pg (ref 26.0–34.0)
MCHC: 34.7 g/dL (ref 30.0–36.0)
MCV: 85.2 fL (ref 80.0–100.0)
Platelets: 270 10*3/uL (ref 150–400)
RBC: 5.28 MIL/uL (ref 4.22–5.81)
RDW: 13.8 % (ref 11.5–15.5)
WBC: 16.4 10*3/uL — ABNORMAL HIGH (ref 4.0–10.5)
nRBC: 0 % (ref 0.0–0.2)

## 2020-11-29 LAB — RESP PANEL BY RT-PCR (FLU A&B, COVID) ARPGX2
Influenza A by PCR: NEGATIVE
Influenza B by PCR: NEGATIVE
SARS Coronavirus 2 by RT PCR: NEGATIVE

## 2020-11-29 LAB — MAGNESIUM: Magnesium: 2 mg/dL (ref 1.7–2.4)

## 2020-11-29 LAB — TSH: TSH: 1.028 u[IU]/mL (ref 0.350–4.500)

## 2020-11-29 LAB — LIPASE, BLOOD: Lipase: 32 U/L (ref 11–51)

## 2020-11-29 LAB — HIV ANTIBODY (ROUTINE TESTING W REFLEX): HIV Screen 4th Generation wRfx: NONREACTIVE

## 2020-11-29 MED ORDER — ACETAMINOPHEN 650 MG RE SUPP: 650.0000 mg | Freq: Four times a day (QID) | RECTAL | Status: DC | PRN

## 2020-11-29 MED ORDER — SODIUM CHLORIDE 0.9 % IV BOLUS
1000.0000 mL | Freq: Once | INTRAVENOUS | Status: AC
  Administered 2020-11-29: 1000 mL via INTRAVENOUS

## 2020-11-29 MED ORDER — NICOTINE 14 MG/24HR TD PT24
14.0000 mg | MEDICATED_PATCH | Freq: Every day | TRANSDERMAL | Status: DC
  Filled 2020-11-29 (×4): qty 1

## 2020-11-29 MED ORDER — ONDANSETRON 4 MG PO TBDP
4.0000 mg | ORAL_TABLET | Freq: Once | ORAL | Status: AC
  Administered 2020-11-29: 4 mg via ORAL
  Filled 2020-11-29: qty 1

## 2020-11-29 MED ORDER — CAPSAICIN 0.075 % EX CREA
TOPICAL_CREAM | Freq: Two times a day (BID) | CUTANEOUS | Status: DC
  Filled 2020-11-29 (×2): qty 60

## 2020-11-29 MED ORDER — ONDANSETRON HCL 4 MG/2ML IJ SOLN
4.0000 mg | Freq: Once | INTRAMUSCULAR | Status: AC
  Administered 2020-11-29: 4 mg via INTRAVENOUS
  Filled 2020-11-29: qty 2

## 2020-11-29 MED ORDER — FENTANYL CITRATE (PF) 100 MCG/2ML IJ SOLN
100.0000 ug | Freq: Once | INTRAMUSCULAR | Status: AC
  Administered 2020-11-29: 100 ug via INTRAVENOUS
  Filled 2020-11-29: qty 2

## 2020-11-29 MED ORDER — HYDRALAZINE HCL 20 MG/ML IJ SOLN: 5.0000 mg | INTRAMUSCULAR | Status: DC | PRN

## 2020-11-29 MED ORDER — DICYCLOMINE HCL 20 MG PO TABS
20.0000 mg | ORAL_TABLET | Freq: Two times a day (BID) | ORAL | Status: DC
  Administered 2020-11-29 – 2020-11-30 (×4): 20 mg via ORAL
  Filled 2020-11-29 (×5): qty 1

## 2020-11-29 MED ORDER — SODIUM CHLORIDE 0.9% FLUSH
3.0000 mL | Freq: Two times a day (BID) | INTRAVENOUS | Status: DC
  Administered 2020-11-29 – 2020-12-01 (×3): 3 mL via INTRAVENOUS

## 2020-11-29 MED ORDER — DILTIAZEM HCL ER COATED BEADS 120 MG PO CP24
120.0000 mg | ORAL_CAPSULE | Freq: Every day | ORAL | Status: DC
  Administered 2020-11-29 – 2020-12-03 (×5): 120 mg via ORAL
  Filled 2020-11-29 (×5): qty 1

## 2020-11-29 MED ORDER — LORAZEPAM 2 MG/ML IJ SOLN
0.5000 mg | INTRAMUSCULAR | Status: DC | PRN
  Administered 2020-11-29 – 2020-12-01 (×9): 0.5 mg via INTRAVENOUS
  Filled 2020-11-29 (×9): qty 1

## 2020-11-29 MED ORDER — ENOXAPARIN SODIUM 40 MG/0.4ML IJ SOSY
40.0000 mg | PREFILLED_SYRINGE | INTRAMUSCULAR | Status: DC
  Administered 2020-11-29 – 2020-11-30 (×2): 40 mg via SUBCUTANEOUS
  Filled 2020-11-29 (×2): qty 0.4

## 2020-11-29 MED ORDER — LACTATED RINGERS IV SOLN: INTRAVENOUS | Status: DC

## 2020-11-29 MED ORDER — ACETAMINOPHEN 325 MG PO TABS
650.0000 mg | ORAL_TABLET | Freq: Four times a day (QID) | ORAL | Status: DC | PRN
  Administered 2020-12-03: 650 mg via ORAL
  Filled 2020-11-29: qty 2

## 2020-11-29 NOTE — H&P (Signed)
History and Physical    Alejandro Flores ZJQ:[id number] DOB: 11/18/1980 DOA: 11/29/2020  PCP: Ponciano Ort  Consultants:  Tonye Royalty - neurology Patient coming from:  Home - lives with mother; NOK: Mother, Cherre Robins, [phone]  Chief Complaint: Intractable n/v  HPI: Alejandro Flores is a 40 y.o. male with medical history significant of afib; depression/anxiety/ and ETOH dependence in remission with prior h/o pancreatitis presenting with n/v.  He was initially seen on 6/6 for this issue; [place] with fatty liver, negative for gallbladder disease and CT negative for pancreatitis; he was discharged on antiemetics and Bentyl.   He returned yesterday and bloodwork was again unremarkable so he was discharged with outpatient GI f/u and given Zofran.  His symptoms persisted and so he returned again today.  He reports periumbilical pain and n/v.  He also has had some dark stools, maybe 3-4 in the last few days.  He was noted to have an abrasion on his right hand and when queried he reported that this was from making himself forcefully vomit.  He also acknowledges vaping tobacco and marijuana routinely.    ED Course:  3rd visit in 3 days for n/v.  Has a remote h/o necrotizing pancreatitis, no longer drinking.  Looks mildly dry, WBC 16, CT negative, normal lipase.  ?cyclical vomiting, denies marijuana.  Prolonged QTc 574.  Review of Systems: As per HPI; otherwise review of systems reviewed and negative.   Ambulatory Status:  Ambulates without assistance  COVID Vaccine Status:  Complete - J&J  Past Medical History:  Diagnosis Date   Anxiety    Atrial fibrillation ([place])    Appears associated with alcohol abuse and withdrawal   Depression    Has a counselor at [employer].   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   Pancreatic pseudocyst/cyst    Spring of 2015 per patient- states at [facility]    Past Surgical History:  Procedure Laterality Date    colonoscopy with polypectomy  2016   [facility]--had rectal bleeding and one was precancerous    Social History   Socioeconomic History   Marital status: Divorced    Spouse name: Not on file   Number of children: 0   Years of education: Not on file   Highest education level: Not on file  Occupational History   Occupation: Chef    Comment: Producer, television/film/video   Tobacco Use   Smoking status: Every Day    Packs/day: 0.50    Years: 4.00    Pack years: 2.00    Types: Cigarettes   Smokeless tobacco: Never  Substance and Sexual Activity   Alcohol use: No    Alcohol/week: 0.0 standard drinks    Comment: Close to 200 days ago with treatment at [facility] and [facility] and alcohol free.  Going 2-3 times to AA weekly   Drug use: No    Types: Marijuana    Comment: MJ in college   Sexual activity: Not Currently  Other Topics Concern   Not on file  Social History Narrative   On leave from [employer] on staying of alcohol.  Not sure if he can do chef work and stay away from alcohol   Lives with family as his previous roommates were binge drinkers   No children   Divorced   Social Determinants of Radio broadcast assistant Strain: Not on file  Food Insecurity: Not  on file  Transportation Needs: Not on file  Physical Activity: Not on file  Stress: Not on file  Social Connections: Not on file  Intimate Partner Violence: Not on file    Allergies  Allergen Reactions   Morphine Itching and Rash    "hives"    Family History  Problem Relation Age of Onset   Breast cancer Mother 31   Hyperlipidemia Mother    Anxiety disorder Mother    Alcohol abuse Father    Heart disease Father 83       Triple bypass    COPD Father        chronic smoker   Alcohol abuse Brother    Depression Brother     Prior to Admission medications   Medication Sig Start Date End Date Taking? Authorizing Provider  acetaminophen (TYLENOL) 500 MG tablet  Take 1,000 mg by mouth every 6 (six) hours as needed for mild pain, fever or headache.   Yes [provider]  dicyclomine (BENTYL) 20 MG tablet Take 1 tablet (20 mg total) by mouth 2 (two) times daily. 11/26/20  Yes Alroy Bailiff, Margaux, PA-C  diltiazem (CARDIZEM CD) 120 MG 24 hr capsule 1 tab once daily by mouth 08/06/15  Yes Mack Hook, MD  LORazepam (ATIVAN) 2 MG tablet Take 0.5 mg by mouth every 6 (six) hours as needed for anxiety.   Yes [provider]  metoCLOPramide (REGLAN) 10 MG tablet Take 1 tablet (10 mg total) by mouth every 6 (six) hours. 11/26/20  Yes Venter, Margaux, PA-C  ondansetron (ZOFRAN ODT) 4 MG disintegrating tablet Take 1 tablet (4 mg total) by mouth every 8 (eight) hours as needed for nausea or vomiting. 11/28/20  Yes Tacy Learn, PA-C  potassium chloride (KLOR-CON) 10 MEQ tablet Take 1 tablet (10 mEq total) by mouth daily. 11/28/20  Yes Tacy Learn, PA-C  sertraline (ZOLOFT) 100 MG tablet Take 100 mg by mouth daily.   Yes [provider]    Physical Exam: Vitals:   11/29/20 1215 11/29/20 1230 11/29/20 1307 11/29/20 1312  BP: 139/85 (!) 144/82 (!) 141/83 (!) 141/83  Pulse: 82 (!) 54 73 72  Resp: (!) 26 15 18 18   Temp:   98.7 F (37.1 C) 98.7 F (37.1 C)  TempSrc:   Oral Oral  SpO2: 95% 94% 99% 98%  Weight:      Height:         General:  Appears calm and comfortable and is in NAD; anxious and emotionally labile Eyes:  EOMI, normal lids, iris; repetitive blinking ENT:  grossly normal hearing, lips & tongue, mmm; very poor dentition - reports "these aren't meth teeth" very specifically Neck:  no LAD, masses or thyromegaly Cardiovascular:  RRR, no m/r/g. No LE edema.  Respiratory:   CTA bilaterally with no wheezes/rales/rhonchi.  Normal respiratory effort. Abdomen:  soft, mild periumbilical TTP, ND, NABS Skin:  no rash or induration seen on limited exam; abrasion on 3rd R knuckle, as noted above - patient acknowledges this was a  dental scratch from forcing himself to vomit Musculoskeletal:  grossly normal tone BUE/BLE, good ROM, no bony abnormality Psychiatric:  anxious and emotionally labile mood and affect, speech fluent and appropriate, AOx3 Neurologic:  CN 2-12 grossly intact, moves all extremities in coordinated fashion    Radiological Exams on Admission: Independently reviewed - see discussion in A/P where applicable  CT ABDOMEN PELVIS WO CONTRAST  Result Date: 11/29/2020 CLINICAL DATA:  Nausea and vomiting, question complicated pancreatitis EXAM:  CT ABDOMEN AND PELVIS WITHOUT CONTRAST TECHNIQUE: Multidetector CT imaging of the abdomen and pelvis was performed following the standard protocol without IV contrast. COMPARISON:  Three days ago FINDINGS: Lower chest:  No contributory findings. Hepatobiliary: Borderline hepatic steatosis with pericholecystic sparing.no evidence of biliary obstruction or stone. Pancreas: Unremarkable. Spleen: Unremarkable. Adrenals/Urinary Tract: Negative adrenals. No hydronephrosis or stone. Unremarkable bladder. Stomach/Bowel:  No obstruction. No appendicitis. Vascular/Lymphatic: No acute vascular abnormality. Calcified nodules or nodes on the retroperitoneum, incidental to the history. No mass or adenopathy. Reproductive:No pathologic findings. Other: No ascites or pneumoperitoneum. Musculoskeletal: No acute abnormalities. IMPRESSION: Stable CT.  No acute finding. Electronically Signed   By: Monte Fantasia M.D.   On: 11/29/2020 06:42   DG Chest 2 View  Result Date: 11/29/2020 CLINICAL DATA:  Chest pain EXAM: CHEST - 2 VIEW COMPARISON:  11/26/2020 FINDINGS: The heart size and mediastinal contours are within normal limits. Both lungs are clear. The visualized skeletal structures are unremarkable. IMPRESSION: No active cardiopulmonary disease. Electronically Signed   By: Fidela Salisbury MD   On: 11/29/2020 03:14    EKG: Independently reviewed.  NSR with rate 90; prolonged QTc 574; nonspecific  ST changes with no evidence of acute ischemia   Labs on Admission: I have personally reviewed the available labs and imaging studies at the time of the admission.  Pertinent labs:   Uremarkable CMP, bili 2.0 WBC 16.4 UA: small Hgb, 80 ketones, 30 protein   Assessment/Plan Principal Problem:   Intractable vomiting with nausea Active Problems:   Anxiety disorder   Depression   Tourette's   Prolonged QT interval   Class 2 obesity due to excess calories with body mass index (BMI) of 35.0 to 35.9 in adult   Marijuana abuse   Tobacco dependence   Cyclical vomiting -Patient with 3 visits in 3 days due to recurrent n/v -On evaluation, patient had an excoriation on his R hand that he acknowledged was from forcing himself to vomit -May be related to cannabinoid hyperemesis syndrome, as he is continuing to use (last use yesterday) -He is undergoing significant life stress associated with his mother's advanced cancer diagnosis (he is her caregiver) -Imaging on 6/6 and today both negative - no evidence of pancreatitis, renal stones, or other significant pathology -Overall, his labs and imaging appear benign at this time -UA has ketonuria, c/w dehydration -She received a multitude of treatments in the ER including Zostrix; Haldol; Toradol; Ativan; and Zofran -No documented emesis since arrival in the ER -Given that he has had 3 ER evaluations in 3 days, will observe overnight -Anticipate d/c to home tomorrow -Encourage marijuana cessation and a good bowel regimen as well as behavioral health support  -Will check stool guaiac -Continue PO Bentyl -Hold anti-emetics due to prolonged QT, so will treat nausea with Ativan for now  Prolonged QTc -Likely associated with dehydration, anticipate resolution once volume status is normalized -Will attempt to avoid QT-prolonging medications such as PPI, nausea meds, SSRIs -Repeat EKG at noon, 2200, and tomorrow AM -Monitor on telemetry  SVT -h/o  SVT -Continue Cardizem  Mood d/o -Emotionally labile while in the ER -Continue Zoloft -prn Ativan  -Tourette's noted at time of admission - repetitive blinking  Obesity -Body mass index is 35.74 kg/m..  -Weight loss should be encouraged -Outpatient PCP/bariatric medicine/bariatric surgery f/u encouraged   Marijuana abuse -Cessation encouraged; this should be encouraged on an ongoing basis -UDS ordered  -Reports prior h/o ETOH dependence but no ETOH in years per report  Tobacco abuse -Encourage cessation.   -This was discussed with the patient and should be reviewed on an ongoing basis.   -Patch declined.   Note: This patient has been tested and is negative for the novel coronavirus COVID-19. The patient has been fully vaccinated against COVID-19.   Level of care: Telemetry Medical DVT prophylaxis:  Lovenox  Code Status:  Full - confirmed with patient Family Communication: None present Disposition Plan:  The patient is from: home  Anticipated d/c is to: home without [facility] services   Anticipated d/c date will depend on clinical response to treatment, but possibly as early as tomorrow if he has excellent response to treatment  Patient is currently: acutely ill Consults called: Nutrition  Admission status:  It is my clinical opinion that referral for OBSERVATION is reasonable and necessary in this patient based on the above information provided. The aforementioned taken together are felt to place the patient at high risk for further clinical deterioration. However it is anticipated that the patient may be medically stable for discharge from the hospital within 24 to 48 hours.    Karmen Bongo MD [facility]   How to contact the [facility] Attending or Consulting provider [place] or covering provider during after hours [place], for this patient?  Check the care team in [facility] and look for a) attending/consulting TR[facility] provider listed and b) the [facility] team listed Log into  [email or web address] and use [place] universal password to access. If you do not have the password, please contact the hospital operator. Locate the [facility] provider you are looking for under [facility] and page to a number that you can be directly reached. If you still have difficulty reaching the provider, please page the [facility] (Director on Call) for the Hospitalists listed on amion for assistance.   11/29/2020, 5:22 PM

## 2020-11-29 NOTE — Progress Notes (Signed)
Pt has allergy for capsicum cream. Redness and burning of skin when applied

## 2020-11-29 NOTE — ED Notes (Signed)
Pt received from lobby at this time.

## 2020-11-29 NOTE — ED Notes (Signed)
Pt came out of bathroom for approx. the 4th time since 0700 & stated that he is feeling nauseated, it has started to "burn when I pee" as well as "my kidneys are starting to hurt." Attending Lorin Mercy, MD made aware.

## 2020-11-29 NOTE — ED Triage Notes (Signed)
Pt comes via [facility] for generalized abd pain for 5 days, seen earlier today for the same, hx of pancreatitis

## 2020-11-29 NOTE — Plan of Care (Signed)

## 2020-11-29 NOTE — ED Provider Notes (Signed)
Emergency Department Provider Note   I have reviewed the triage vital signs and the nursing notes.   HISTORY  Chief Complaint Abdominal Pain   HPI Alejandro Flores is a 40 y.o. male with PMH of pancreatitis presents to the ED for evaluation of continued abdominal pain and vomiting. He has been seen in the ED multiple times in the last 3 days with vomiting and cramping abdominal pain.  Patient states that the pain feels similar to his pancreatitis although when he had complicated pancreatitis back in 2017 he stopped drinking at that time.  Patient states that he has been treated in the emergency department on the past 2 occasions and feels better but then suddenly will feel worse when he gets home.  No fevers.  Patient did have some diarrhea earlier.  No chest pain or shortness of breath.   Past Medical History:  Diagnosis Date   Anxiety    Atrial fibrillation ([place])    Appears associated with alcohol abuse and withdrawal   Depression    Has a counselor at [employer].   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   Pancreatic pseudocyst/cyst    Spring of 2015 per patient- [facility] at [facility]    Patient Active Problem List   Diagnosis Date Noted   Intractable vomiting with nausea 11/29/2020   Alcoholism ([place]) 08/06/2015   Paroxysmal atrial fibrillation ([place]) 08/06/2015   Anxiety disorder 08/06/2015   Panic disorder 08/06/2015   Depression 08/06/2015   Pancreatitis 08/06/2015   Gout 08/06/2015   Kidney stones 08/06/2015   Tourette's 08/06/2015    Past Surgical History:  Procedure Laterality Date   colonoscopy with polypectomy  2016   [facility]--had rectal bleeding and one was precancerous    Allergies Morphine  Family History  Problem Relation Age of Onset   Breast cancer Mother 33   Hyperlipidemia Mother    Anxiety disorder Mother    Alcohol abuse Father    Heart disease Father 9        Triple bypass    COPD Father        chronic smoker   Alcohol abuse Brother    Depression Brother     Social History Social History   Tobacco Use   Smoking status: Every Day    Packs/day: 0.50    Years: 4.00    Pack years: 2.00    Types: Cigarettes   Smokeless tobacco: Never  Substance Use Topics   Alcohol use: No    Alcohol/week: 0.0 standard drinks    Comment: Close to 200 days ago with treatment at [facility] and [facility] and alcohol free.  Going 2-3 times to AA weekly   Drug use: No    Types: Marijuana    Comment: MJ in college    Review of Systems  Constitutional: No fever/chills Eyes: No visual changes. ENT: No sore throat. Cardiovascular: Denies chest pain. Respiratory: Denies shortness of breath. Gastrointestinal: Positive abdominal pain. Positive nausea and vomiting.  No diarrhea.  No constipation. Genitourinary: Negative for dysuria. Musculoskeletal: Negative for back pain. Skin: Negative for rash. Neurological: Negative for headaches, focal weakness or numbness.  10-point ROS otherwise negative.  ____________________________________________   PHYSICAL EXAM:  VITAL SIGNS: ED Triage Vitals  Enc Vitals Group     BP 11/29/20 0232 (!) 130/92     Pulse Rate 11/29/20 0232 69     Resp 11/29/20 0232  18     Temp 11/29/20 0232 98 F (36.7 C)     Temp Source 11/29/20 0232 Oral     SpO2 11/29/20 0232 99 %     Weight 11/29/20 0535 249 lb 1.9 oz (113 kg)     Height 11/29/20 0535 5\' 10"  (1.778 m)    Constitutional: Alert and oriented. Well appearing and in no acute distress. Eyes: Conjunctivae are normal.  Head: Atraumatic. Nose: No congestion/rhinnorhea. Mouth/Throat: Mucous membranes are moist.   Neck: No stridor.   Cardiovascular: Normal rate, regular rhythm. Good peripheral circulation. Grossly normal heart sounds.   Respiratory: Normal respiratory effort.  No retractions. Lungs CTAB. Gastrointestinal: Soft and nontender. No distention.   Musculoskeletal: No lower extremity tenderness nor edema. No gross deformities of extremities. Neurologic:  Normal speech and language. No gross focal neurologic deficits are appreciated.  Skin:  Skin is warm, dry and intact. No rash noted.   ____________________________________________   LABS (all labs ordered are listed, but only abnormal results are displayed)  Labs Reviewed  COMPREHENSIVE METABOLIC PANEL - Abnormal; Notable for the following components:      Result Value   CO2 21 (*)    Glucose, Bld 105 (*)    Total Bilirubin 2.0 (*)    All other components within normal limits  CBC - Abnormal; Notable for the following components:   WBC 16.4 (*)    All other components within normal limits  URINALYSIS, ROUTINE W REFLEX MICROSCOPIC - Abnormal; Notable for the following components:   Hgb urine dipstick SMALL (*)    Ketones, ur 80 (*)    Protein, ur 30 (*)    All other components within normal limits  RESP PANEL BY RT-PCR (FLU A&B, COVID) ARPGX2  LIPASE, BLOOD  MAGNESIUM  HIV ANTIBODY (ROUTINE TESTING W REFLEX)  TSH  RAPID URINE DRUG SCREEN, HOSP PERFORMED  BASIC METABOLIC PANEL  CBC   ____________________________________________  EKG  EKG Reviewed. NSR. No ST elevation or depression. Prolonged QT.  ____________________________________________  RADIOLOGY  CT ABDOMEN PELVIS WO CONTRAST  Result Date: 11/29/2020 CLINICAL DATA:  Nausea and vomiting, question complicated pancreatitis EXAM: CT ABDOMEN AND PELVIS WITHOUT CONTRAST TECHNIQUE: Multidetector CT imaging of the abdomen and pelvis was performed following the standard protocol without IV contrast. COMPARISON:  Three days ago FINDINGS: Lower chest:  No contributory findings. Hepatobiliary: Borderline hepatic steatosis with pericholecystic sparing.no evidence of biliary obstruction or stone. Pancreas: Unremarkable. Spleen: Unremarkable. Adrenals/Urinary Tract: Negative adrenals. No hydronephrosis or stone. Unremarkable  bladder. Stomach/Bowel:  No obstruction. No appendicitis. Vascular/Lymphatic: No acute vascular abnormality. Calcified nodules or nodes on the retroperitoneum, incidental to the history. No mass or adenopathy. Reproductive:No pathologic findings. Other: No ascites or pneumoperitoneum. Musculoskeletal: No acute abnormalities. IMPRESSION: Stable CT.  No acute finding. Electronically Signed   By: Monte Fantasia M.D.   On: 11/29/2020 06:42   DG Chest 2 View  Result Date: 11/29/2020 CLINICAL DATA:  Chest pain EXAM: CHEST - 2 VIEW COMPARISON:  11/26/2020 FINDINGS: The heart size and mediastinal contours are within normal limits. Both lungs are clear. The visualized skeletal structures are unremarkable. IMPRESSION: No active cardiopulmonary disease. Electronically Signed   By: Fidela Salisbury MD   On: 11/29/2020 03:14    ____________________________________________   PROCEDURES  Procedure(s) performed:   Procedures  None ____________________________________________   INITIAL IMPRESSION / ASSESSMENT AND PLAN / ED COURSE  Pertinent labs & imaging results that were available during my care of the patient were reviewed by me and considered  in my medical decision making (see chart for details).   Patient presents to the emergency department with continued nausea and vomiting.  This is his third ED visit in the past 3 days.  Labs from triage show leukocytosis worsening from prior.  No acute electrolyte abnormality.  He does have ketones on UA but no sign of infection.  Lipase is normal.  I did repeat CT scan with continued/worsening symptoms and increasing white count.  CT without contrast shows no acute findings and stable from prior. Patient does have a prolonged QT.   Discussed patient's case with [facility] to request admission. Patient and family (if present) updated with plan. Care transferred to [facility] service.  I reviewed all nursing notes, vitals, pertinent old records, EKGs, labs, imaging (as  available).  ____________________________________________  FINAL CLINICAL IMPRESSION(S) / ED DIAGNOSES  Final diagnoses:  Generalized abdominal pain  Nausea vomiting and diarrhea  Prolonged Q-T interval on ECG     MEDICATIONS GIVEN DURING THIS VISIT:  Medications  diltiazem (CARDIZEM CD) 24 hr capsule 120 mg (120 mg Oral Given 11/29/20 1122)  dicyclomine (BENTYL) tablet 20 mg (20 mg Oral Given 11/29/20 1044)  sodium chloride flush (NS) 0.9 % injection 3 mL (3 mLs Intravenous Given 11/29/20 1039)  lactated ringers infusion ( Intravenous New Bag/Given 11/29/20 1048)  acetaminophen (TYLENOL) tablet 650 mg (has no administration in time range)    Or  acetaminophen (TYLENOL) suppository 650 mg (has no administration in time range)  nicotine (NICODERM CQ - dosed in mg/24 hours) patch 14 mg (14 mg Transdermal Patient Refused/Not Given 11/29/20 1046)  hydrALAZINE (APRESOLINE) injection 5 mg (has no administration in time range)  capsicum (ZOSTRIX) 0.075 % cream ( Topical Given 11/29/20 1518)  LORazepam (ATIVAN) injection 0.5 mg (0.5 mg Intravenous Given 11/29/20 1510)  enoxaparin (LOVENOX) injection 40 mg (40 mg Subcutaneous Given 11/29/20 1045)  ondansetron (ZOFRAN-ODT) disintegrating tablet 4 mg (4 mg Oral Given 11/29/20 0344)  sodium chloride 0.9 % bolus 1,000 mL (0 mLs Intravenous Stopped 11/29/20 0730)  ondansetron (ZOFRAN) injection 4 mg (4 mg Intravenous Given 11/29/20 0548)  fentaNYL (SUBLIMAZE) injection 100 mcg (100 mcg Intravenous Given 11/29/20 0548)     Note:  This document was prepared using Dragon voice recognition software and may include unintentional dictation errors.  Nanda Quinton, MD, [facility]    Llesenia Fogal, [facility], MD 11/29/20 [phone]

## 2020-11-29 NOTE — ED Notes (Signed)
ED Provider at bedside. 

## 2020-11-29 NOTE — ED Notes (Signed)
Pt alert, NAD, calm interactive, resps e/u, speaking in clear complete sentences, c/o abd pain and nausea.

## 2020-11-30 ENCOUNTER — Other Ambulatory Visit: Payer: Self-pay

## 2020-11-30 ENCOUNTER — Encounter (HOSPITAL_COMMUNITY): Payer: Self-pay | Admitting: Internal Medicine

## 2020-11-30 DIAGNOSIS — F17200 Nicotine dependence, unspecified, uncomplicated: Secondary | ICD-10-CM

## 2020-11-30 DIAGNOSIS — F459 Somatoform disorder, unspecified: Secondary | ICD-10-CM | POA: Diagnosis present

## 2020-11-30 DIAGNOSIS — F1021 Alcohol dependence, in remission: Secondary | ICD-10-CM | POA: Diagnosis present

## 2020-11-30 DIAGNOSIS — K319 Disease of stomach and duodenum, unspecified: Secondary | ICD-10-CM | POA: Diagnosis present

## 2020-11-30 DIAGNOSIS — Z818 Family history of other mental and behavioral disorders: Secondary | ICD-10-CM | POA: Diagnosis not present

## 2020-11-30 DIAGNOSIS — Z20822 Contact with and (suspected) exposure to covid-19: Secondary | ICD-10-CM | POA: Diagnosis present

## 2020-11-30 DIAGNOSIS — E876 Hypokalemia: Secondary | ICD-10-CM | POA: Diagnosis present

## 2020-11-30 DIAGNOSIS — K580 Irritable bowel syndrome with diarrhea: Secondary | ICD-10-CM | POA: Diagnosis present

## 2020-11-30 DIAGNOSIS — Z6835 Body mass index (BMI) 35.0-35.9, adult: Secondary | ICD-10-CM | POA: Diagnosis not present

## 2020-11-30 DIAGNOSIS — I2510 Atherosclerotic heart disease of native coronary artery without angina pectoris: Secondary | ICD-10-CM | POA: Diagnosis present

## 2020-11-30 DIAGNOSIS — K2211 Ulcer of esophagus with bleeding: Secondary | ICD-10-CM | POA: Diagnosis present

## 2020-11-30 DIAGNOSIS — R112 Nausea with vomiting, unspecified: Secondary | ICD-10-CM | POA: Diagnosis present

## 2020-11-30 DIAGNOSIS — R1084 Generalized abdominal pain: Secondary | ICD-10-CM | POA: Diagnosis present

## 2020-11-30 DIAGNOSIS — K2960 Other gastritis without bleeding: Secondary | ICD-10-CM | POA: Diagnosis not present

## 2020-11-30 DIAGNOSIS — F4000 Agoraphobia, unspecified: Secondary | ICD-10-CM | POA: Diagnosis present

## 2020-11-30 DIAGNOSIS — M109 Gout, unspecified: Secondary | ICD-10-CM | POA: Diagnosis present

## 2020-11-30 DIAGNOSIS — R9431 Abnormal electrocardiogram [ECG] [EKG]: Secondary | ICD-10-CM

## 2020-11-30 DIAGNOSIS — F952 Tourette's disorder: Secondary | ICD-10-CM | POA: Diagnosis present

## 2020-11-30 DIAGNOSIS — F410 Panic disorder [episodic paroxysmal anxiety] without agoraphobia: Secondary | ICD-10-CM | POA: Diagnosis present

## 2020-11-30 DIAGNOSIS — F32A Depression, unspecified: Secondary | ICD-10-CM | POA: Diagnosis present

## 2020-11-30 DIAGNOSIS — K760 Fatty (change of) liver, not elsewhere classified: Secondary | ICD-10-CM | POA: Diagnosis present

## 2020-11-30 DIAGNOSIS — Z79899 Other long term (current) drug therapy: Secondary | ICD-10-CM | POA: Diagnosis not present

## 2020-11-30 DIAGNOSIS — I471 Supraventricular tachycardia: Secondary | ICD-10-CM | POA: Diagnosis not present

## 2020-11-30 DIAGNOSIS — E6609 Other obesity due to excess calories: Secondary | ICD-10-CM | POA: Diagnosis present

## 2020-11-30 DIAGNOSIS — F411 Generalized anxiety disorder: Secondary | ICD-10-CM | POA: Diagnosis present

## 2020-11-30 DIAGNOSIS — F1210 Cannabis abuse, uncomplicated: Secondary | ICD-10-CM

## 2020-11-30 DIAGNOSIS — R1013 Epigastric pain: Secondary | ICD-10-CM | POA: Diagnosis not present

## 2020-11-30 DIAGNOSIS — I480 Paroxysmal atrial fibrillation: Secondary | ICD-10-CM | POA: Diagnosis present

## 2020-11-30 DIAGNOSIS — Z811 Family history of alcohol abuse and dependence: Secondary | ICD-10-CM | POA: Diagnosis not present

## 2020-11-30 DIAGNOSIS — F17210 Nicotine dependence, cigarettes, uncomplicated: Secondary | ICD-10-CM | POA: Diagnosis present

## 2020-11-30 LAB — BASIC METABOLIC PANEL
Anion gap: 12 (ref 5–15)
BUN: 8 mg/dL (ref 6–20)
CO2: 25 mmol/L (ref 22–32)
Calcium: 8.7 mg/dL — ABNORMAL LOW (ref 8.9–10.3)
Chloride: 101 mmol/L (ref 98–111)
Creatinine, Ser: 0.91 mg/dL (ref 0.61–1.24)
GFR, Estimated: >60 mL/min (ref >60)
Glucose, Bld: 99 mg/dL (ref 70–99)
Potassium: 3.7 mmol/L (ref 3.5–5.1)
Sodium: 138 mmol/L (ref 135–145)

## 2020-11-30 LAB — RAPID URINE DRUG SCREEN, HOSP PERFORMED
Amphetamines: NOT DETECTED
Barbiturates: NOT DETECTED
Benzodiazepines: POSITIVE — AB
Cocaine: NOT DETECTED
Opiates: NOT DETECTED
Tetrahydrocannabinol: POSITIVE — AB

## 2020-11-30 LAB — CBC
HCT: 43.7 % (ref 39.0–52.0)
Hemoglobin: 15.2 g/dL (ref 13.0–17.0)
MCH: 29.5 pg (ref 26.0–34.0)
MCHC: 34.8 g/dL (ref 30.0–36.0)
MCV: 84.7 fL (ref 80.0–100.0)
Platelets: 228 10*3/uL (ref 150–400)
RBC: 5.16 MIL/uL (ref 4.22–5.81)
RDW: 13.7 % (ref 11.5–15.5)
WBC: 13.3 10*3/uL — ABNORMAL HIGH (ref 4.0–10.5)
nRBC: 0 % (ref 0.0–0.2)

## 2020-11-30 MED ORDER — PROSOURCE PLUS PO LIQD
30.0000 mL | Freq: Two times a day (BID) | ORAL | Status: DC
  Administered 2020-12-01 – 2020-12-03 (×6): 30 mL via ORAL
  Filled 2020-11-30 (×6): qty 30

## 2020-11-30 MED ORDER — FAMOTIDINE IN NACL 20-0.9 MG/50ML-% IV SOLN
20.0000 mg | Freq: Two times a day (BID) | INTRAVENOUS | Status: DC
  Administered 2020-11-30 (×2): 20 mg via INTRAVENOUS
  Filled 2020-11-30 (×3): qty 50

## 2020-11-30 MED ORDER — SERTRALINE HCL 100 MG PO TABS
100.0000 mg | ORAL_TABLET | Freq: Every day | ORAL | Status: DC
  Administered 2020-11-30 – 2020-12-03 (×4): 100 mg via ORAL
  Filled 2020-11-30 (×4): qty 1

## 2020-11-30 MED ORDER — ADULT MULTIVITAMIN W/MINERALS CH
1.0000 | ORAL_TABLET | Freq: Every day | ORAL | Status: DC
  Administered 2020-11-30 – 2020-12-03 (×4): 1 via ORAL
  Filled 2020-11-30 (×4): qty 1

## 2020-11-30 MED ORDER — BOOST / RESOURCE BREEZE PO LIQD CUSTOM
1.0000 | Freq: Three times a day (TID) | ORAL | Status: DC
  Administered 2020-12-01 – 2020-12-03 (×9): 1 via ORAL

## 2020-11-30 NOTE — Final Consult Note (Addendum)
Referring Provider:  [facility]         Primary Care Physician:  Mack Hook, MD Primary Gastroenterologist:   Althia Forts          We were asked to see this patient for:    nausea and vomiting            ASSESSMENT / PLAN:   # 40 yo male admitted a presenting to ED a third time with persistent upper abdominal pain, nausea / vomiting, dark stool. Today reports a small amount of bright red blood in emesis. His hgb is normal at 15.2. BUN normal. Lipase.  RUQ [place] and two noncon CT scans have all been unremarkable. Total bilirubin is 2.0 but no hepatobiliary findings on imaging.   Cannabis hyperemesis?    --For further evaluation of nausea, vomiting, upper abdominal pain, [facility] of stomach cancer patient will be scheduled for EGD (probably tomorrow). The risks and benefits of EGD with possible biopsies was discussed with the patient and they agree to proceed.  --No evidence for significant GI bleeding but will stop Lovenox for now.  --He isn't getting any anti-emetics, probably due to prolonged QTc.  --Clears okay today, NPO after MN.  --Add IV Pepcid --Discontinue THC  # Prolonged QTc. On Telemetry --Limits use of anti-emetics.   # Leukocytosis, ? Reactive. CXR negative. UA negative  # [place] of gastric cancer in mother (in her 65)  # Hx of SVT. On cardizem.      Attending Physician Note   I have taken an interval history, reviewed the chart and examined the patient. I agree with the Advanced Practitioner's note, impression and recommendations.   Persistent N/V, small amount of BRB in emesis. Imaging studies and blood work are unremarkable. Antiemetics held with prolonged QTc. 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 R/O ulcer, esophagitis, etc. Discontinue THC use. Scheduled Ativan dosing could be more effective - defer to primary service. EGD tomorrow as Endo unit was not able to accommodate an EGD today.   Lucio Edward, MD FACG [phone]        HPI:                                                                                                                              Chief Complaint: abdominal pain, nausea , vomiting, blood in vomit and dark stools  Alejandro Flores is a 40 y.o. male with a past medical history significant for colon polyps, generalized anxiety disorder, panic disorder, depression, agoraphobia, Etoh related pancreatitis, tobacco abuse, Etoh abuse, obesity, Afib ( not anticoagulated).   Alejandro Flores was brought by EMS to ED on 11/26/20 with vomiting and upper abdominal pain. His WBC was 21.7K.  Lipase and liver tests unremarkable. RUQ showed steatosis. Non-contrast CT scan was unremarkable. His symptoms improved, he was discharged from ED. It appears he returned to ED two days later on 6/8 with ongoing abdominal pain, poor PO intake and complaints of coffee ground looking stools. His  WBC was down to 12.7. Hgb stable at 15.8. He was discharged home but brought back to ED by EMS yesterday morning for ongoing symptoms. .WBC up to 16.4, hgb still stable at 15.6. Repeat Noncon CT scan was unremarkable. He was admitted to observation.    The crampy upper abdominal pain, nausea / vomited started Sunday morning ( ~ 5 days ago). The upper abdominal pain radiates through to his back, it is worse with PO intake.  He doesn't take NSAIDS. He hasn't consumed Etoh since having pancreatitis a few years ago. He gives a vague history of dark stools at home ( can't say for sure when it started) and says that today his stool contained "coffee ground" material. Additionally today he vomited a small amount of red blood. He does use THC.     Patient gives a history of colon polyps in 2016 [facility]). A couple of years later he was followed by GI ( he says at [facility]) for an episode of pancreatitis complicated by necrosis and pseudocysts. He hasn't seen a GI since then. He also hasn't consumed Etoh since then. I couldn't locate any GI records from [place] / [place]    Per patient he had a colonoscopy in 2016 in [facility].Three polyps were removed, one was pre-cancerous.    Past Medical History:  Diagnosis Date   Anxiety    Atrial fibrillation ([place])    Appears associated with alcohol abuse and withdrawal   Depression    Has a counselor at [employer].   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 Authorizing Provider  acetaminophen (TYLENOL) 500 MG tablet Take 1,000 mg by mouth every 6 (six) hours as needed for mild pain, fever or headache.   Yes [provider]  dicyclomine (BENTYL) 20 MG tablet Take 1 tablet (20 mg total) by mouth 2 (two) times daily. 11/26/20  Yes Alroy Bailiff, Margaux, PA-C  diltiazem (CARDIZEM CD) 120 MG 24 hr capsule 1 tab once daily by mouth 08/06/15  Yes Mack Hook, MD  LORazepam (ATIVAN) 2 MG tablet Take 0.5 mg by mouth every 6 (six) hours as needed for anxiety.   Yes [provider]  metoCLOPramide (REGLAN) 10 MG tablet Take 1 tablet (10 mg total) by mouth every 6 (six) hours. 11/26/20  Yes Venter, Margaux, PA-C  ondansetron (ZOFRAN ODT) 4 MG disintegrating tablet Take 1 tablet (4 mg total) by mouth every 8 (eight) hours as needed for nausea or vomiting. 11/28/20  Yes Tacy Learn, PA-C  potassium chloride (KLOR-CON) 10 MEQ tablet Take 1 tablet (10 mEq total) by mouth daily. 11/28/20  Yes Tacy Learn, PA-C  sertraline (ZOLOFT) 100 MG tablet Take 100 mg by mouth daily.   Yes [provider]    Current Facility-Administered Medications  Medication Dose Route Frequency  Provider Last Rate Last Admin   acetaminophen (TYLENOL) tablet 650 mg  650 mg Oral Q6H PRN Karmen Bongo, MD       Or   acetaminophen (TYLENOL) suppository 650 mg  650  mg Rectal Q6H PRN Karmen Bongo, MD       dicyclomine (BENTYL) tablet 20 mg  20 mg Oral BID Karmen Bongo, MD   20 mg at 11/30/20 0855   diltiazem (CARDIZEM CD) 24 hr capsule 120 mg  120 mg Oral Daily Karmen Bongo, MD   120 mg at 11/30/20 0856   enoxaparin (LOVENOX) injection 40 mg  40 mg Subcutaneous Q24H Karmen Bongo, MD   40 mg at 11/30/20 0856   hydrALAZINE (APRESOLINE) injection 5 mg  5 mg Intravenous Q4H PRN Karmen Bongo, MD       lactated ringers infusion   Intravenous Continuous Karmen Bongo, MD 100 mL/hr at 11/29/20 2035 New Bag at 11/29/20 2035   LORazepam (ATIVAN) injection 0.5 mg  0.5 mg Intravenous Q4H PRN Karmen Bongo, MD   0.5 mg at 11/30/20 2423   nicotine (NICODERM CQ - dosed in mg/24 hours) patch 14 mg  14 mg Transdermal Daily Karmen Bongo, MD       sodium chloride flush (NS) 0.9 % injection 3 mL  3 mL Intravenous Lillia Mountain, MD   3 mL at 11/30/20 0857    Allergies as of 11/29/2020 - Review Complete 11/29/2020  Allergen Reaction Noted   Morphine Itching and Rash 12/03/2015    Family History  Problem Relation Age of Onset   Breast cancer Mother 86   Hyperlipidemia Mother    Anxiety disorder Mother    Alcohol abuse Father    Heart disease Father 44       Triple bypass    COPD Father        chronic smoker   Alcohol abuse Brother    Depression Brother     Social History   Socioeconomic History   Marital status: Divorced    Spouse name: Not on file   Number of children: 0   Years of education: Not on file   Highest education level: Not on file  Occupational History   Occupation: Chef    Comment: Producer, television/film/video   Tobacco Use   Smoking status: Every Day    Packs/day: 0.50    Years: 4.00    Pack years: 2.00    Types: Cigarettes   Smokeless  tobacco: Never  Substance and Sexual Activity   Alcohol use: No    Alcohol/week: 0.0 standard drinks    Comment: Close to 200 days ago with treatment at [facility] and [facility] and alcohol free.  Going 2-3 times to AA weekly   Drug use: No    Types: Marijuana    Comment: MJ in college   Sexual activity: Not Currently  Other Topics Concern   Not on file  Social History Narrative   On leave from [employer] on staying of alcohol.  Not sure if he can do chef work and stay away from alcohol   Lives with family as his previous roommates were binge drinkers   No children   Divorced   Social Determinants of Radio broadcast assistant Strain: Not on file  Food Insecurity: Not on file  Transportation Needs: Not on file  Physical Activity: Not on file  Stress: Not on file  Social Connections: Not on file  Intimate Partner Violence: Not on file    Review of Systems: All systems reviewed and negative except where noted in HPI.    OBJECTIVE:    Physical Exam: Vital signs in last 24 hours: Temp:  [98.5 F (36.9 C)-99.2 F (37.3  C)] 98.6 F (37 C) (06/10 0438) Pulse Rate:  [54-87] 87 (06/10 0438) Resp:  [8-26] 18 (06/10 0438) BP: (110-154)/(69-89) 154/78 (06/10 0438) SpO2:  [94 %-99 %] 96 % (06/10 0438) Last BM Date: 11/29/20 General:   Alert  male in NAD Psych:  Pleasant, cooperative. Normal mood and affect. Excessive blinking ( tic?) Eyes:  Pupils equal, sclera clear, no icterus.   Conjunctiva pink. Ears:  Normal auditory acuity. Nose:  No deformity, discharge,  or lesions. Neck:  Supple; no masses Lungs:  Clear throughout to auscultation.   No wheezes, crackles, or rhonchi.  Heart:  Regular rate and rhythm; no murmurs, no lower extremity edema Abdomen:  Soft, non-distended, nontender, BS active, no palp mass   Rectal:  Deferred  Msk:  Symmetrical without gross deformities. . Neurologic:  Alert and  oriented x4;  grossly normal  neurologically. Skin:  Intact without significant lesions or rashes.  Filed Weights   11/29/20 0535  Weight: 113 kg     Scheduled inpatient medications  dicyclomine  20 mg Oral BID   diltiazem  120 mg Oral Daily   enoxaparin (LOVENOX) injection  40 mg Subcutaneous Q24H   nicotine  14 mg Transdermal Daily   sodium chloride flush  3 mL Intravenous Q12H      Intake/Output from previous day: 06/09 0701 - 06/10 0700 In: 1816.7 [P.O.:120; I.V.:1696.7] Out: 800 [Urine:800] Intake/Output this shift: Total I/O In: 3 [I.V.:3] Out: -    Lab Results: Recent Labs    11/28/20 1036 11/29/20 0233 11/30/20 0152  WBC 12.7* 16.4* 13.3*  HGB 15.8 15.6 15.2  HCT 44.8 45.0 43.7  PLT 245 270 228   BMET Recent Labs    11/28/20 1036 11/29/20 0233 11/30/20 0152  NA 137 137 138  K 3.0* 3.5 3.7  CL 103 103 101  CO2 25 21* 25  GLUCOSE 107* 105* 99  BUN 11 11 8   CREATININE 0.76 0.89 0.91  CALCIUM 8.8* 8.9 8.7*   LFT Recent Labs    11/29/20 0233  PROT 7.3  ALBUMIN 4.3  AST 30  ALT 29  ALKPHOS 60  BILITOT 2.0*   PT/INR No results for input(s): LABPROT, INR in the last 72 hours. Hepatitis Panel No results for input(s): HEPBSAG, HCVAB, HEPAIGM, HEPBIGM in the last 72 hours.   . CBC Latest Ref Rng & Units 11/30/2020 11/29/2020 11/28/2020  WBC 4.0 - 10.5 K/uL 13.3(H) 16.4(H) 12.7(H)  Hemoglobin 13.0 - 17.0 g/dL 15.2 15.6 15.8  Hematocrit 39.0 - 52.0 % 43.7 45.0 44.8  Platelets 150 - 400 K/uL 228 270 245    . CMP Latest Ref Rng & Units 11/30/2020 11/29/2020 11/28/2020  Glucose 70 - 99 mg/dL 99 105(H) 107(H)  BUN 6 - 20 mg/dL 8 11 11   Creatinine 0.61 - 1.24 mg/dL 0.91 0.89 0.76  Sodium 135 - 145 mmol/L 138 137 137  Potassium 3.5 - 5.1 mmol/L 3.7 3.5 3.0(L)  Chloride 98 - 111 mmol/L 101 103 103  CO2 22 - 32 mmol/L 25 21(L) 25  Calcium 8.9 - 10.3 mg/dL 8.7(L) 8.9 8.8(L)  Total Protein 6.5 - 8.1 g/dL - 7.3 7.6  Total Bilirubin 0.3 - 1.2 mg/dL - 2.0(H) 1.0  Alkaline Phos 38 -  126 U/L - 60 64  AST 15 - 41 U/L - 30 23  ALT 0 - 44 U/L - 29 29   Studies/Results: CT ABDOMEN PELVIS WO CONTRAST  Result Date: 11/29/2020 CLINICAL DATA:  Nausea and vomiting, question complicated pancreatitis EXAM:  CT ABDOMEN AND PELVIS WITHOUT CONTRAST TECHNIQUE: Multidetector CT imaging of the abdomen and pelvis was performed following the standard protocol without IV contrast. COMPARISON:  Three days ago FINDINGS: Lower chest:  No contributory findings. Hepatobiliary: Borderline hepatic steatosis with pericholecystic sparing.no evidence of biliary obstruction or stone. Pancreas: Unremarkable. Spleen: Unremarkable. Adrenals/Urinary Tract: Negative adrenals. No hydronephrosis or stone. Unremarkable bladder. Stomach/Bowel:  No obstruction. No appendicitis. Vascular/Lymphatic: No acute vascular abnormality. Calcified nodules or nodes on the retroperitoneum, incidental to the history. No mass or adenopathy. Reproductive:No pathologic findings. Other: No ascites or pneumoperitoneum. Musculoskeletal: No acute abnormalities. IMPRESSION: Stable CT.  No acute finding. Electronically Signed   By: Monte Fantasia M.D.   On: 11/29/2020 06:42   DG Chest 2 View  Result Date: 11/29/2020 CLINICAL DATA:  Chest pain EXAM: CHEST - 2 VIEW COMPARISON:  11/26/2020 FINDINGS: The heart size and mediastinal contours are within normal limits. Both lungs are clear. The visualized skeletal structures are unremarkable. IMPRESSION: No active cardiopulmonary disease. Electronically Signed   By: Fidela Salisbury MD   On: 11/29/2020 03:14    Principal Problem:   Intractable vomiting with nausea Active Problems:   Anxiety disorder   Depression   Tourette's   Prolonged QT interval   Class 2 obesity due to excess calories with body mass index (BMI) of 35.0 to 35.9 in adult   Marijuana abuse   Tobacco dependence    Tye Savoy, NP-C @  11/30/2020, 11:20 AM

## 2020-11-30 NOTE — Progress Notes (Signed)
PROGRESS NOTE  Alejandro Flores  SVX:[id number] DOB: 1981/03/07 DOA: 11/29/2020 PCP: Mack Hook, MD   Brief Narrative: Alejandro Flores is a 40 y.o. male with a history of GAD with agoraphobia, panic disorder, depression, EtOH abuse in remission, alcoholic pancreatitis w/hx necrosis, tobacco use, obesity, and SVT (?possibly AFib) who presented to the ED for the third time in 3 days on 6/9 with intractable nausea and vomiting. Despite largely reassuring work up, he was brought in for observation with IV fluids and ativan, in lieu of typical antiemetics due to prolonged QTc. Symptoms have not abated at all, so GI is consulted, planning EGD 6/11.   Assessment & Plan: Principal Problem:   Intractable vomiting with nausea Active Problems:   Anxiety disorder   Depression   Tourette's   Prolonged QT interval   Class 2 obesity due to excess calories with body mass index (BMI) of 35.0 to 35.9 in adult   Marijuana abuse   Tobacco dependence   Intractable nausea and vomiting  Intractable nausea and vomiting: ?CVS vs. cannabinoid hyperemesis with +THC on arrival. Unremarkable CT abd/pelvis and only chronic hepatic steatosis on U/S. LFTs ok, lipase wnl (hx pancreatitis)  - Avoid THC - Continue IVF and IV ativan prn. Latest QTc is >479msec. Depending on trend, benefit of antiemetic may outweigh risk. - Recheck LFTs due to bili 2.0.  - Empiric antireflux medication - Continue bentyl  Hematemesis: Small volume in setting of repeated vomiting suspicious for MW tear. hgb 15.2. +FH stomach CA.  - GI consulted, planning EGD 6/11.  - Monitor CBC  CAD, depression, panic disorder:  - Continue sertraline, recommend continued outpatient follow up.   Leukocytosis: Suspected to be reactive in the absence of infectious nidus. Trending downward consistently without antimicrobial Tx.  - Continue monitoring.   Prolonged QT interval and history of SVT:  - Optimize Mg, K - Continue diltiazem - Continue telemetry.    Tobacco use:  - Nicotine patch, cessation counseling.   Marijuana use:  - Cessation counseling  Obesity: Estimated body mass index is 35.74 kg/m as calculated from the following:   Height as of this encounter: 5\' 10"  (1.778 m).   Weight as of this encounter: 113 kg.  DVT prophylaxis: SCDs Code Status: Full Family Communication: None at bedside Disposition Plan:  Status is: Inpatient  Remains inpatient appropriate because:Ongoing diagnostic testing needed not appropriate for outpatient work up and IV treatments appropriate due to intensity of illness or inability to take PO  Dispo: The patient is from: Home              Anticipated d/c is to: Home              Patient currently is not medically stable to d/c.   Difficult to place patient No  Consultants:  [facility]  Procedures:  EGD 12/01/2020  Antimicrobials: None   Subjective: Still unable to keep anything down. Reports bilious emesis and possible feculent vomitus. Burning in abd/chest stable. Nausea 23 out of 24 hours of the day. Severe, constant. Also some red blood in emesis, no blood seen in stool but did have fecal incontinence.  Objective: Vitals:   11/29/20 1814 11/29/20 2102 11/30/20 0438 11/30/20 1258  BP: 135/80 (!) 141/88 (!) 154/78 132/85  Pulse: 70 68 87 70  Resp: 17 18 18 18   Temp: 99.2 F (37.3 C) 98.5 F (36.9 C) 98.6 F (37 C) 98.4 F (36.9 C)  TempSrc: Oral Oral Oral Oral  SpO2: 98% 99% 96%  98%  Weight:      Height:        Intake/Output Summary (Last 24 hours) at 11/30/2020 1503 Last data filed at 11/30/2020 0857 Gross per 24 hour  Intake 1400.77 ml  Output 800 ml  Net 600.77 ml   Filed Weights   11/29/20 0535  Weight: 113 kg    Gen: 40 y.o. male in no distress Pulm: Non-labored breathing room air. Clear to auscultation bilaterally.  CV: Regular rate and rhythm. No murmur, rub, or gallop. No JVD, no pitting pedal edema. GI: Abdomen soft, modestly diffusely tender, non-distended,  with normoactive bowel sounds. No organomegaly or masses felt. Ext: Warm, no deformities Skin: Scabbed abrasions on dorsal right hand 2-4 MCPs Neuro: Alert and oriented. No gross focal neurological deficits. Psych: Judgement and insight appear intact. Mood anxious with broad affect.   Data Reviewed: I have personally reviewed following labs and imaging studies  CBC: Recent Labs  Lab 11/26/20 0620 11/28/20 1036 11/29/20 0233 11/30/20 0152  WBC 21.7* 12.7* 16.4* 13.3*  NEUTROABS 18.4* 8.6*  --   --   HGB 16.2 15.8 15.6 15.2  HCT 46.2 44.8 45.0 43.7  MCV 86.0 84.1 85.2 84.7  PLT 234 245 270 865   Basic Metabolic Panel: Recent Labs  Lab 11/26/20 0620 11/28/20 1036 11/29/20 0233 11/30/20 0152  NA 141 137 137 138  K 3.6 3.0* 3.5 3.7  CL 107 103 103 101  CO2 19* 25 21* 25  GLUCOSE 127* 107* 105* 99  BUN 16 11 11 8   CREATININE 1.04 0.76 0.89 0.91  CALCIUM 9.6 8.8* 8.9 8.7*  MG  --   --  2.0  --    GFR: Estimated Creatinine Clearance: 134.5 mL/min (by C-G formula based on SCr of 0.91 mg/dL). Liver Function Tests: Recent Labs  Lab 11/26/20 0620 11/28/20 1036 11/29/20 0233  AST 30 23 30   ALT 42 29 29  ALKPHOS 72 64 60  BILITOT 0.8 1.0 2.0*  PROT 8.6* 7.6 7.3  ALBUMIN 5.1* 4.4 4.3   Recent Labs  Lab 11/26/20 0620 11/28/20 1036 11/29/20 0233  LIPASE 27 31 32   No results for input(s): AMMONIA in the last 168 hours. Coagulation Profile: No results for input(s): INR, PROTIME in the last 168 hours. Cardiac Enzymes: No results for input(s): CKTOTAL, CKMB, CKMBINDEX, TROPONINI in the last 168 hours. BNP (last 3 results) No results for input(s): PROBNP in the last 8760 hours. HbA1C: No results for input(s): HGBA1C in the last 72 hours. CBG: No results for input(s): GLUCAP in the last 168 hours. Lipid Profile: No results for input(s): CHOL, HDL, LDLCALC, TRIG, CHOLHDL, LDLDIRECT in the last 72 hours. Thyroid Function Tests: Recent Labs    11/29/20 0941  TSH  1.028   Anemia Panel: No results for input(s): VITAMINB12, FOLATE, FERRITIN, TIBC, IRON, RETICCTPCT in the last 72 hours. Urine analysis:    Component Value Date/Time   COLORURINE YELLOW 11/29/2020 0428   APPEARANCEUR CLEAR 11/29/2020 0428   LABSPEC 1.024 11/29/2020 0428   PHURINE 5.0 11/29/2020 0428   GLUCOSEU NEGATIVE 11/29/2020 0428   HGBUR SMALL (A) 11/29/2020 0428   BILIRUBINUR NEGATIVE 11/29/2020 0428   KETONESUR 80 (A) 11/29/2020 0428   PROTEINUR 30 (A) 11/29/2020 0428   NITRITE NEGATIVE 11/29/2020 0428   LEUKOCYTESUR NEGATIVE 11/29/2020 0428   Recent Results (from the past 240 hour(s))  Resp Panel by RT-PCR (Flu A&B, Covid) Nasopharyngeal Swab     Status: None   Collection Time: 11/26/20  8:14  AM   Specimen: Nasopharyngeal Swab; Nasopharyngeal(NP) swabs in vial transport medium  Result Value Ref Range Status   SARS Coronavirus 2 by RT PCR NEGATIVE NEGATIVE Final    Comment: (NOTE) SARS-CoV-2 target nucleic acids are NOT DETECTED.  The SARS-CoV-2 RNA is generally detectable in upper respiratory specimens during the acute phase of infection. The lowest concentration of SARS-CoV-2 viral copies this assay can detect is 138 copies/mL. A negative result does not preclude SARS-Cov-2 infection and should not be used as the sole basis for treatment or other patient management decisions. A negative result may occur with  improper specimen collection/handling, submission of specimen other than nasopharyngeal swab, presence of viral mutation(s) within the areas targeted by this assay, and inadequate number of viral copies(<138 copies/mL). A negative result must be combined with clinical observations, patient history, and epidemiological information. The expected result is Negative.  Fact Sheet for Patients:  EntrepreneurPulse.com.au  Fact Sheet for Healthcare Providers:  IncredibleEmployment.be  This test is no t yet approved or cleared by  the [place] FDA and  has been authorized for detection and/or diagnosis of SARS-CoV-2 by FDA under an Emergency Use Authorization (EUA). This EUA will remain  in effect (meaning this test can be used) for the duration of the COVID-19 declaration under Section 564(b)(1) of the Act, 21 U.S.C.section 360bbb-3(b)(1), unless the authorization is terminated  or revoked sooner.       Influenza A by PCR NEGATIVE NEGATIVE Final   Influenza B by PCR NEGATIVE NEGATIVE Final    Comment: (NOTE) The Xpert Xpress SARS-CoV-2/FLU/RSV plus assay is intended as an aid in the diagnosis of influenza from Nasopharyngeal swab specimens and should not be used as a sole basis for treatment. Nasal washings and aspirates are unacceptable for Xpert Xpress SARS-CoV-2/FLU/RSV testing.  Fact Sheet for Patients: EntrepreneurPulse.com.au  Fact Sheet for Healthcare Providers: IncredibleEmployment.be  This test is not yet approved or cleared by the [place] FDA and has been authorized for detection and/or diagnosis of SARS-CoV-2 by FDA under an Emergency Use Authorization (EUA). This EUA will remain in effect (meaning this test can be used) for the duration of the COVID-19 declaration under Section 564(b)(1) of the Act, 21 U.S.C. section 360bbb-3(b)(1), unless the authorization is terminated or revoked.  Performed at [facility], [place] [address] [place], [place] [address]   Resp Panel by RT-PCR (Flu A&B, Covid) Nasopharyngeal Swab     Status: None   Collection Time: 11/29/20 11:35 AM   Specimen: Nasopharyngeal Swab; Nasopharyngeal(NP) swabs in vial transport medium  Result Value Ref Range Status   SARS Coronavirus 2 by RT PCR NEGATIVE NEGATIVE Final    Comment: (NOTE) SARS-CoV-2 target nucleic acids are NOT DETECTED.  The SARS-CoV-2 RNA is generally detectable in upper respiratory specimens during the acute phase of infection. The  lowest concentration of SARS-CoV-2 viral copies this assay can detect is 138 copies/mL. A negative result does not preclude SARS-Cov-2 infection and should not be used as the sole basis for treatment or other patient management decisions. A negative result may occur with  improper specimen collection/handling, submission of specimen other than nasopharyngeal swab, presence of viral mutation(s) within the areas targeted by this assay, and inadequate number of viral copies(<138 copies/mL). A negative result must be combined with clinical observations, patient history, and epidemiological information. The expected result is Negative.  Fact Sheet for Patients:  EntrepreneurPulse.com.au  Fact Sheet for Healthcare Providers:  IncredibleEmployment.be  This test is no t yet approved or  cleared by the [place] and  has been authorized for detection and/or diagnosis of SARS-CoV-2 by FDA under an Emergency Use Authorization (EUA). This EUA will remain  in effect (meaning this test can be used) for the duration of the COVID-19 declaration under Section 564(b)(1) of the Act, 21 U.S.C.section 360bbb-3(b)(1), unless the authorization is terminated  or revoked sooner.       Influenza A by PCR NEGATIVE NEGATIVE Final   Influenza B by PCR NEGATIVE NEGATIVE Final    Comment: (NOTE) The Xpert Xpress SARS-CoV-2/FLU/RSV plus assay is intended as an aid in the diagnosis of influenza from Nasopharyngeal swab specimens and should not be used as a sole basis for treatment. Nasal washings and aspirates are unacceptable for Xpert Xpress SARS-CoV-2/FLU/RSV testing.  Fact Sheet for Patients: EntrepreneurPulse.com.au  Fact Sheet for Healthcare Providers: IncredibleEmployment.be  This test is not yet approved or cleared by the [place] FDA and has been authorized for detection and/or diagnosis of SARS-CoV-2 by FDA under  an Emergency Use Authorization (EUA). This EUA will remain in effect (meaning this test can be used) for the duration of the COVID-19 declaration under Section 564(b)(1) of the Act, 21 U.S.C. section 360bbb-3(b)(1), unless the authorization is terminated or revoked.  Performed at [facility], [place] [address], [place], [place] [address]       Radiology Studies: CT ABDOMEN PELVIS WO CONTRAST  Result Date: 11/29/2020 CLINICAL DATA:  Nausea and vomiting, question complicated pancreatitis EXAM: CT ABDOMEN AND PELVIS WITHOUT CONTRAST TECHNIQUE: Multidetector CT imaging of the abdomen and pelvis was performed following the standard protocol without IV contrast. COMPARISON:  Three days ago FINDINGS: Lower chest:  No contributory findings. Hepatobiliary: Borderline hepatic steatosis with pericholecystic sparing.no evidence of biliary obstruction or stone. Pancreas: Unremarkable. Spleen: Unremarkable. Adrenals/Urinary Tract: Negative adrenals. No hydronephrosis or stone. Unremarkable bladder. Stomach/Bowel:  No obstruction. No appendicitis. Vascular/Lymphatic: No acute vascular abnormality. Calcified nodules or nodes on the retroperitoneum, incidental to the history. No mass or adenopathy. Reproductive:No pathologic findings. Other: No ascites or pneumoperitoneum. Musculoskeletal: No acute abnormalities. IMPRESSION: Stable CT.  No acute finding. Electronically Signed   By: Monte Fantasia M.D.   On: 11/29/2020 06:42   DG Chest 2 View  Result Date: 11/29/2020 CLINICAL DATA:  Chest pain EXAM: CHEST - 2 VIEW COMPARISON:  11/26/2020 FINDINGS: The heart size and mediastinal contours are within normal limits. Both lungs are clear. The visualized skeletal structures are unremarkable. IMPRESSION: No active cardiopulmonary disease. Electronically Signed   By: Fidela Salisbury MD   On: 11/29/2020 03:14    Scheduled Meds:  dicyclomine  20 mg Oral BID   diltiazem  120 mg Oral Daily   nicotine  14 mg Transdermal  Daily   sodium chloride flush  3 mL Intravenous Q12H   Continuous Infusions:  famotidine (PEPCID) IV 20 mg (11/30/20 1405)   lactated ringers 100 mL/hr at 11/30/20 1455     LOS: 0 days   Time spent: 25 minutes.  Patrecia Pour, MD [facility] [email or web address] 11/30/2020, 3:03 PM

## 2020-11-30 NOTE — Anesthesia Preprocedure Evaluation (Addendum)
Anesthesia Evaluation  Patient identified by MRN, date of birth, ID band Patient awake    Reviewed: Allergy & Precautions, NPO status , Patient's Chart, lab work & pertinent test results  History of Anesthesia Complications Negative for: history of anesthetic complications  Airway Mallampati: II  TM Distance: >3 FB Neck ROM: Full    Dental  (+) Dental Advisory Given   Pulmonary Current Smoker and Patient abstained from smoking.,  11/29/2020 SARS coronavirus NEG   breath sounds clear to auscultation       Cardiovascular hypertension, Pt. on medications (-) angina+ dysrhythmias Atrial Fibrillation  Rhythm:Regular Rate:Normal     Neuro/Psych Anxiety Depression negative neurological ROS     GI/Hepatic (+)     substance abuse  alcohol use and marijuana use, Hematemesis, diarrhea   Endo/Other  Morbid obesity  Renal/GU stones     Musculoskeletal   Abdominal (+) + obese,   Peds  Hematology negative hematology ROS (+)   Anesthesia Other Findings   Reproductive/Obstetrics                            Anesthesia Physical Anesthesia Plan  ASA: 3  Anesthesia Plan: MAC   Post-op Pain Management:    Induction:   PONV Risk Score and Plan: 0  Airway Management Planned: Natural Airway and Nasal Cannula  Additional Equipment: None  Intra-op Plan:   Post-operative Plan:   Informed Consent: I have reviewed the patients History and Physical, chart, labs and discussed the procedure including the risks, benefits and alternatives for the proposed anesthesia with the patient or authorized representative who has indicated his/her understanding and acceptance.     Dental advisory given  Plan Discussed with: CRNA and Surgeon  Anesthesia Plan Comments:        Anesthesia Quick Evaluation

## 2020-11-30 NOTE — Progress Notes (Signed)
Initial Nutrition Assessment  DOCUMENTATION CODES:  Obesity unspecified  INTERVENTION:  Continue to advance diet as tolerated.  Add Boost Breeze po TID, each supplement provides 250 kcal and 9 grams of protein.  Add 30 ml ProSource Plus po BID, each supplement provides 100 kcal and 15 grams of protein.  Add MVI with minerals daily.  If pt does not tolerate diet advancement over the weekend, consider nutrition support. Consider Cortrak placement and starting TF first if not contraindicated.    NUTRITION DIAGNOSIS:  Inadequate oral intake related to nausea, vomiting as evidenced by per patient/family report.  GOAL:  Patient will meet greater than or equal to 90% of their needs  MONITOR:  Diet advancement, PO intake, Supplement acceptance, Labs, Weight trends, I & O's  REASON FOR ASSESSMENT:  Consult Assessment of nutrition requirement/status  ASSESSMENT:  40 yo male admitted a presenting to ED a third time with persistent upper abdominal pain, nausea / vomiting, dark stool. Today reports a small amount of bright red blood in emesis. PMH includes A-fib, previous EtOH abuse, pancreatic necrosis, and anxiety/depression.  Spoke with pt at bedside. Pt reports not being able to keep solids or liquids down since Sunday at 8 AM. He reports eating well prior to feeling ill with no complaints or changes in appetite until emesis began.  Pt denies any weight loss, but he is unsure given that he does not weigh himself frequently. Per Epic, pt's weight has only increased in the 5 years since his last admission. No weight history in Care Everywhere.  On exam, pt had no depletions.   Given above pt is not malnourished at this time, but he is at risk for malnutrition if he continues without nutrition.  Recommend advancing diet as tolerated and adding Boost Breeze TID and ProSource Plus BID to promote caloric and protein intake, as well as MVI with minerals daily.  Pt may benefit from scheduled  anti-emetic medications rather than PRN. Also may need to consider nutrition support on Monday if pt does not tolerate diet advancement over the weekend. Communicated this to MD via secure-chat.  Medications: reviewed; Pepcid via IV BID, LR @ 1oo ml/hr, Ativan PRN (given 3 times today)  Labs: reviewed; serum Ca 8.7  NUTRITION - FOCUSED PHYSICAL EXAM: Flowsheet Row Most Recent Value  Orbital Region No depletion  Upper Arm Region No depletion  Thoracic and Lumbar Region No depletion  Buccal Region No depletion  Temple Region No depletion  Clavicle Bone Region No depletion  Clavicle and Acromion Bone Region No depletion  Scapular Bone Region No depletion  Dorsal Hand No depletion  Patellar Region No depletion  Anterior Thigh Region No depletion  Posterior Calf Region No depletion  Edema (RD Assessment) None  Hair Reviewed  Eyes Reviewed  Mouth Reviewed  Skin Reviewed  Nails Reviewed   Diet Order:   Diet Order             Diet clear liquid Room service appropriate? Yes; Fluid consistency: Thin  Diet effective now                  EDUCATION NEEDS:  Education needs have been addressed  Skin:  Skin Assessment: Reviewed RN Assessment  Last BM:  11/29/20  Height:  Ht Readings from Last 1 Encounters:  11/29/20 5\' 10"  (1.778 m)   Weight:  Wt Readings from Last 1 Encounters:  11/29/20 113 kg   Ideal Body Weight:  75.5 kg  BMI:  Body mass index is  35.74 kg/m.  Estimated Nutritional Needs:  Kcal:  2000-2200 Protein:  125-140 grams Fluid:  >2 L  Alejandro Flores, RD, LDN Registered Dietitian I After-Hours/Weekend Pager # in [place]

## 2020-12-01 ENCOUNTER — Inpatient Hospital Stay (HOSPITAL_COMMUNITY): Payer: PRIVATE HEALTH INSURANCE | Admitting: Anesthesiology

## 2020-12-01 ENCOUNTER — Encounter (HOSPITAL_COMMUNITY): Payer: Self-pay | Admitting: Internal Medicine

## 2020-12-01 ENCOUNTER — Encounter (HOSPITAL_COMMUNITY)
Admission: EM | Disposition: A | Discharge status: Home / Self Care | Payer: Self-pay | Source: Home / Self Care | Attending: Family Medicine

## 2020-12-01 DIAGNOSIS — R1013 Epigastric pain: Secondary | ICD-10-CM

## 2020-12-01 DIAGNOSIS — F952 Tourette's disorder: Secondary | ICD-10-CM

## 2020-12-01 DIAGNOSIS — K2960 Other gastritis without bleeding: Secondary | ICD-10-CM

## 2020-12-01 DIAGNOSIS — K920 Hematemesis: Secondary | ICD-10-CM

## 2020-12-01 HISTORY — PX: ESOPHAGOGASTRODUODENOSCOPY (EGD) WITH PROPOFOL: SHX5813

## 2020-12-01 HISTORY — PX: BIOPSY: SHX5522

## 2020-12-01 LAB — COMPREHENSIVE METABOLIC PANEL
ALT: 19 U/L (ref 0–44)
AST: 12 U/L — ABNORMAL LOW (ref 15–41)
Albumin: 3.4 g/dL — ABNORMAL LOW (ref 3.5–5.0)
Alkaline Phosphatase: 51 U/L (ref 38–126)
Anion gap: 11 (ref 5–15)
BUN: 10 mg/dL (ref 6–20)
CO2: 24 mmol/L (ref 22–32)
Calcium: 8.3 mg/dL — ABNORMAL LOW (ref 8.9–10.3)
Chloride: 104 mmol/L (ref 98–111)
Creatinine, Ser: 0.79 mg/dL (ref 0.61–1.24)
GFR, Estimated: >60 mL/min (ref >60)
Glucose, Bld: 74 mg/dL (ref 70–99)
Potassium: 3 mmol/L — ABNORMAL LOW (ref 3.5–5.1)
Sodium: 139 mmol/L (ref 135–145)
Total Bilirubin: 1 mg/dL (ref 0.3–1.2)
Total Protein: 5.9 g/dL — ABNORMAL LOW (ref 6.5–8.1)

## 2020-12-01 LAB — CBC
HCT: 40.9 % (ref 39.0–52.0)
Hemoglobin: 14.3 g/dL (ref 13.0–17.0)
MCH: 29.8 pg (ref 26.0–34.0)
MCHC: 35 g/dL (ref 30.0–36.0)
MCV: 85.2 fL (ref 80.0–100.0)
Platelets: 224 10*3/uL (ref 150–400)
RBC: 4.8 MIL/uL (ref 4.22–5.81)
RDW: 13.9 % (ref 11.5–15.5)
WBC: 10.4 10*3/uL (ref 4.0–10.5)
nRBC: 0 % (ref 0.0–0.2)

## 2020-12-01 LAB — MAGNESIUM: Magnesium: 2.2 mg/dL (ref 1.7–2.4)

## 2020-12-01 SURGERY — ESOPHAGOGASTRODUODENOSCOPY (EGD) WITH PROPOFOL
Anesthesia: Monitor Anesthesia Care

## 2020-12-01 MED ORDER — LACTATED RINGERS IV SOLN: INTRAVENOUS | Status: DC | PRN

## 2020-12-01 MED ORDER — PROPOFOL 10 MG/ML IV BOLUS
INTRAVENOUS | Status: DC | PRN
  Administered 2020-12-01: 30 mg via INTRAVENOUS
  Administered 2020-12-01: 20 mg via INTRAVENOUS

## 2020-12-01 MED ORDER — PANTOPRAZOLE SODIUM 40 MG IV SOLR
40.0000 mg | Freq: Two times a day (BID) | INTRAVENOUS | Status: DC
  Administered 2020-12-01 – 2020-12-03 (×6): 40 mg via INTRAVENOUS
  Filled 2020-12-01 (×8): qty 40

## 2020-12-01 MED ORDER — DICYCLOMINE HCL 20 MG PO TABS
20.0000 mg | ORAL_TABLET | Freq: Three times a day (TID) | ORAL | Status: DC
  Administered 2020-12-01 – 2020-12-03 (×11): 20 mg via ORAL
  Filled 2020-12-01 (×13): qty 1

## 2020-12-01 MED ORDER — LACTATED RINGERS IV SOLN
INTRAVENOUS | Status: AC | PRN
  Administered 2020-12-01: 1000 mL via INTRAVENOUS

## 2020-12-01 MED ORDER — POTASSIUM CHLORIDE 2 MEQ/ML IV SOLN
INTRAVENOUS | Status: DC
  Filled 2020-12-01 (×6): qty 1000

## 2020-12-01 MED ORDER — LORAZEPAM 2 MG/ML IJ SOLN
0.5000 mg | Freq: Four times a day (QID) | INTRAMUSCULAR | Status: DC
  Administered 2020-12-01 – 2020-12-04 (×12): 0.5 mg via INTRAVENOUS
  Filled 2020-12-01 (×12): qty 1

## 2020-12-01 MED ORDER — ONDANSETRON HCL 4 MG/2ML IJ SOLN
4.0000 mg | Freq: Three times a day (TID) | INTRAMUSCULAR | Status: DC | PRN
  Administered 2020-12-02: 4 mg via INTRAVENOUS
  Filled 2020-12-01: qty 2

## 2020-12-01 MED ORDER — PROPOFOL 500 MG/50ML IV EMUL
INTRAVENOUS | Status: DC | PRN
  Administered 2020-12-01: 125 ug/kg/min via INTRAVENOUS

## 2020-12-01 SURGICAL SUPPLY — 15 items

## 2020-12-01 NOTE — Progress Notes (Addendum)
PROGRESS NOTE  Alejandro Flores  LYY:[id number] DOB: Nov 11, 1980 DOA: 11/29/2020 PCP: Mack Hook, MD   Brief Narrative: Alejandro Flores is a 40 y.o. male with a history of GAD with agoraphobia, panic disorder, depression, EtOH abuse in remission, alcoholic pancreatitis w/hx necrosis, tobacco use, obesity, and SVT (?possibly AFib) who presented to the ED for the third time in 3 days on 6/9 with intractable nausea and vomiting. Despite largely reassuring work up, he was brought in for observation with IV fluids and ativan, in lieu of typical antiemetics due to prolonged QTc. Symptoms have not abated at all, so GI is consulted, planning EGD 6/11.   Assessment & Plan: Principal Problem:   Intractable vomiting with nausea Active Problems:   Anxiety disorder   Depression   Tourette's   Prolonged QT interval   Class 2 obesity due to excess calories with body mass index (BMI) of 35.0 to 35.9 in adult   Marijuana abuse   Tobacco dependence   Intractable nausea and vomiting   Abdominal pain, epigastric   Coffee ground emesis  Intractable nausea and vomiting: ?CVS vs. cannabinoid hyperemesis with +THC on arrival. Unremarkable CT abd/pelvis and only chronic hepatic steatosis on U/S. LFTs ok, lipase wnl (hx pancreatitis). TBili normalized. - Avoid THC - Will change ativan to scheduled as pre-existing anxiety is exacerbated by this clinical setting.  - With improvements in QTc as discussed below, we will try low dose zofran prn and continue telemetry monitoring.  - Increase bentyl to QIS dosing per GI.   Grade B esophagitis and mild erosive gastropathy: On EGD 6/11.  - PPI IV q12h - Reflux precautions - Follow up biopsies.  - Avoid ASA, NSAIDs.   Hematemesis: No stigmata or source of bleeding on EGD 6/11. Hgb remains wnl.   GAD w/agoraphobia, depression, panic disorder, tourette's: Symptoms exacerbated by recent family losses and health conditions. - Continue sertraline, recommend continued  outpatient follow up.   Leukocytosis: Suspected to be reactive in the absence of infectious nidus. Trending downward consistently without antimicrobial Tx and normalized 6/11.   Prolonged QT interval and history of SVT:  - QTc improving, down to 446msec on telemetry this AM. ECG at ~9:30am today personally reviewed with erroneous interpretation due to diminutive t waves. Repeat ECG with QTc 447msec.  - Optimize Mg, K.  - Continue diltiazem - Continue telemetry.   Hypokalemia:  Increase K in IVF with LR w/62mEq KCL @ 100cc/hr, recheck in AM. Mg is 2.2.   Tobacco use:  - Nicotine patch, cessation counseling.   Marijuana use:  - Cessation counseling  History of alcohol abuse: In remission.  Obesity: Estimated body mass index is 35.74 kg/m as calculated from the following:   Height as of this encounter: 5\' 10"  (1.778 m).   Weight as of this encounter: 113 kg.  DVT prophylaxis: SCDs Code Status: Full Family Communication: None at bedside Disposition Plan:  Status is: Inpatient  Remains inpatient appropriate because:Ongoing diagnostic testing needed not appropriate for outpatient work up and IV treatments appropriate due to intensity of illness or inability to take PO  Dispo: The patient is from: Home              Anticipated d/c is to: Home              Patient currently is not medically stable to d/c.   Difficult to place patient No  Consultants:  [facility]  Procedures:  EGD 12/01/2020 Dr. Fuller Plan: Impression:        -  LA Grade B esophagitis with no bleeding.                           Esophagitis could be secondary to vomiting.                           Biopsied.                           - Mild erosive gastropathy with no bleeding and no                           stigmata of recent bleeding. Biopsied.                           - Normal duodenal bulb and second portion of the                           duodenum. Recommendation: - Return patient to hospital ward for ongoing  care.                           - Clear liquid diet today.                           - Follow antireflux measures including HOB elevated                           > 30 degrees at all times.                           - Continue present medications.                           - Discontinue Pepcid.                           - Protonix (pantoprazole) 40 mg IV q12h.                           - Increase Bentyl to 20 mg po qid (ac & hs).                           - Suggest changing Ativan to scheduled dosing for                           anxiety, N/V - defer to primary service.                           - No aspirin, ibuprofen, naproxen, or other                           non-steroidal anti-inflammatory drugs.                           - Await pathology results.  Antimicrobials: None   Subjective: Symptoms slightly improved from yesterday,  has not eaten anything yet. No further bleeding has been reported.   Objective: Vitals:   12/01/20 0710 12/01/20 0811 12/01/20 0826 12/01/20 0842  BP: (!) 155/88 137/83 136/88 138/79  Pulse: 85 75 62 68  Resp: 12 17 20 13   Temp: 99.7 F (37.6 C) (!) 97 F (36.1 C)  (!) 97.5 F (36.4 C)  TempSrc: Oral     SpO2: 97% 97% 95% 96%  Weight:      Height:        Intake/Output Summary (Last 24 hours) at 12/01/2020 1537 Last data filed at 12/01/2020 1240 Gross per 24 hour  Intake 3161.17 ml  Output 1500 ml  Net 1661.17 ml   Filed Weights   11/29/20 0535  Weight: 113 kg   Gen: 40 y.o. male in no distress Pulm: Nonlabored breathing room air. Clear. CV: Regular rate and rhythm. No murmur, rub, or gallop. No JVD, no dependent edema. GI: Abdomen soft, non-tender, non-distended, with normoactive bowel sounds.  Ext: Warm, no deformities Skin: No new rashes, lesions or ulcers on visualized skin. Neuro: Alert and oriented. No focal neurological deficits. Psych: Judgement and insight appear fair. Mood anxious with congruent affect and stable evidence of  tics. Behavior is appropriate, thought process linear.   Data Reviewed: I have personally reviewed following labs and imaging studies  CBC: Recent Labs  Lab 11/26/20 0620 11/28/20 1036 11/29/20 0233 11/30/20 0152 12/01/20 0025  WBC 21.7* 12.7* 16.4* 13.3* 10.4  NEUTROABS 18.4* 8.6*  --   --   --   HGB 16.2 15.8 15.6 15.2 14.3  HCT 46.2 44.8 45.0 43.7 40.9  MCV 86.0 84.1 85.2 84.7 85.2  PLT 234 245 270 228 973   Basic Metabolic Panel: Recent Labs  Lab 11/26/20 0620 11/28/20 1036 11/29/20 0233 11/30/20 0152 12/01/20 0025  NA 141 137 137 138 139  K 3.6 3.0* 3.5 3.7 3.0*  CL 107 103 103 101 104  CO2 19* 25 21* 25 24  GLUCOSE 127* 107* 105* 99 74  BUN 16 11 11 8 10   CREATININE 1.04 0.76 0.89 0.91 0.79  CALCIUM 9.6 8.8* 8.9 8.7* 8.3*  MG  --   --  2.0  --  2.2   GFR: Estimated Creatinine Clearance: 153 mL/min (by C-G formula based on SCr of 0.79 mg/dL). Liver Function Tests: Recent Labs  Lab 11/26/20 0620 11/28/20 1036 11/29/20 0233 12/01/20 0025  AST 30 23 30  12*  ALT 42 29 29 19   ALKPHOS 72 64 60 51  BILITOT 0.8 1.0 2.0* 1.0  PROT 8.6* 7.6 7.3 5.9*  ALBUMIN 5.1* 4.4 4.3 3.4*   Recent Labs  Lab 11/26/20 0620 11/28/20 1036 11/29/20 0233  LIPASE 27 31 32   No results for input(s): AMMONIA in the last 168 hours. Coagulation Profile: No results for input(s): INR, PROTIME in the last 168 hours. Cardiac Enzymes: No results for input(s): CKTOTAL, CKMB, CKMBINDEX, TROPONINI in the last 168 hours. BNP (last 3 results) No results for input(s): PROBNP in the last 8760 hours. HbA1C: No results for input(s): HGBA1C in the last 72 hours. CBG: No results for input(s): GLUCAP in the last 168 hours. Lipid Profile: No results for input(s): CHOL, HDL, LDLCALC, TRIG, CHOLHDL, LDLDIRECT in the last 72 hours. Thyroid Function Tests: Recent Labs    11/29/20 0941  TSH 1.028   Anemia Panel: No results for input(s): VITAMINB12, FOLATE, FERRITIN, TIBC, IRON, RETICCTPCT  in the last 72 hours. Urine analysis:    Component Value Date/Time  COLORURINE YELLOW 11/29/2020 0428   APPEARANCEUR CLEAR 11/29/2020 0428   LABSPEC 1.024 11/29/2020 0428   PHURINE 5.0 11/29/2020 0428   GLUCOSEU NEGATIVE 11/29/2020 0428   HGBUR SMALL (A) 11/29/2020 0428   BILIRUBINUR NEGATIVE 11/29/2020 0428   KETONESUR 80 (A) 11/29/2020 0428   PROTEINUR 30 (A) 11/29/2020 0428   NITRITE NEGATIVE 11/29/2020 0428   LEUKOCYTESUR NEGATIVE 11/29/2020 0428   Recent Results (from the past 240 hour(s))  Resp Panel by RT-PCR (Flu A&B, Covid) Nasopharyngeal Swab     Status: None   Collection Time: 11/26/20  8:14 AM   Specimen: Nasopharyngeal Swab; Nasopharyngeal(NP) swabs in vial transport medium  Result Value Ref Range Status   SARS Coronavirus 2 by RT PCR NEGATIVE NEGATIVE Final    Comment: (NOTE) SARS-CoV-2 target nucleic acids are NOT DETECTED.  The SARS-CoV-2 RNA is generally detectable in upper respiratory specimens during the acute phase of infection. The lowest concentration of SARS-CoV-2 viral copies this assay can detect is 138 copies/mL. A negative result does not preclude SARS-Cov-2 infection and should not be used as the sole basis for treatment or other patient management decisions. A negative result may occur with  improper specimen collection/handling, submission of specimen other than nasopharyngeal swab, presence of viral mutation(s) within the areas targeted by this assay, and inadequate number of viral copies(<138 copies/mL). A negative result must be combined with clinical observations, patient history, and epidemiological information. The expected result is Negative.  Fact Sheet for Patients:  EntrepreneurPulse.com.au  Fact Sheet for Healthcare Providers:  IncredibleEmployment.be  This test is no t yet approved or cleared by the [place] FDA and  has been authorized for detection and/or diagnosis of SARS-CoV-2 by FDA  under an Emergency Use Authorization (EUA). This EUA will remain  in effect (meaning this test can be used) for the duration of the COVID-19 declaration under Section 564(b)(1) of the Act, 21 U.S.C.section 360bbb-3(b)(1), unless the authorization is terminated  or revoked sooner.       Influenza A by PCR NEGATIVE NEGATIVE Final   Influenza B by PCR NEGATIVE NEGATIVE Final    Comment: (NOTE) The Xpert Xpress SARS-CoV-2/FLU/RSV plus assay is intended as an aid in the diagnosis of influenza from Nasopharyngeal swab specimens and should not be used as a sole basis for treatment. Nasal washings and aspirates are unacceptable for Xpert Xpress SARS-CoV-2/FLU/RSV testing.  Fact Sheet for Patients: EntrepreneurPulse.com.au  Fact Sheet for Healthcare Providers: IncredibleEmployment.be  This test is not yet approved or cleared by the [place] FDA and has been authorized for detection and/or diagnosis of SARS-CoV-2 by FDA under an Emergency Use Authorization (EUA). This EUA will remain in effect (meaning this test can be used) for the duration of the COVID-19 declaration under Section 564(b)(1) of the Act, 21 U.S.C. section 360bbb-3(b)(1), unless the authorization is terminated or revoked.  Performed at [facility], [place] [address] [place], [place] [address]   Resp Panel by RT-PCR (Flu A&B, Covid) Nasopharyngeal Swab     Status: None   Collection Time: 11/29/20 11:35 AM   Specimen: Nasopharyngeal Swab; Nasopharyngeal(NP) swabs in vial transport medium  Result Value Ref Range Status   SARS Coronavirus 2 by RT PCR NEGATIVE NEGATIVE Final    Comment: (NOTE) SARS-CoV-2 target nucleic acids are NOT DETECTED.  The SARS-CoV-2 RNA is generally detectable in upper respiratory specimens during the acute phase of infection. The lowest concentration of SARS-CoV-2 viral copies this assay can detect is 138 copies/mL. A negative result  does not preclude SARS-Cov-2 infection and should not be used as the sole basis for treatment or other patient management decisions. A negative result may occur with  improper specimen collection/handling, submission of specimen other than nasopharyngeal swab, presence of viral mutation(s) within the areas targeted by this assay, and inadequate number of viral copies(<138 copies/mL). A negative result must be combined with clinical observations, patient history, and epidemiological information. The expected result is Negative.  Fact Sheet for Patients:  EntrepreneurPulse.com.au  Fact Sheet for Healthcare Providers:  IncredibleEmployment.be  This test is no t yet approved or cleared by the [place] FDA and  has been authorized for detection and/or diagnosis of SARS-CoV-2 by FDA under an Emergency Use Authorization (EUA). This EUA will remain  in effect (meaning this test can be used) for the duration of the COVID-19 declaration under Section 564(b)(1) of the Act, 21 U.S.C.section 360bbb-3(b)(1), unless the authorization is terminated  or revoked sooner.       Influenza A by PCR NEGATIVE NEGATIVE Final   Influenza B by PCR NEGATIVE NEGATIVE Final    Comment: (NOTE) The Xpert Xpress SARS-CoV-2/FLU/RSV plus assay is intended as an aid in the diagnosis of influenza from Nasopharyngeal swab specimens and should not be used as a sole basis for treatment. Nasal washings and aspirates are unacceptable for Xpert Xpress SARS-CoV-2/FLU/RSV testing.  Fact Sheet for Patients: EntrepreneurPulse.com.au  Fact Sheet for Healthcare Providers: IncredibleEmployment.be  This test is not yet approved or cleared by the [place] FDA and has been authorized for detection and/or diagnosis of SARS-CoV-2 by FDA under an Emergency Use Authorization (EUA). This EUA will remain in effect (meaning this test can be used) for  the duration of the COVID-19 declaration under Section 564(b)(1) of the Act, 21 U.S.C. section 360bbb-3(b)(1), unless the authorization is terminated or revoked.  Performed at [facility], [place] [address] [place], [place] [address]       Radiology Studies: No results found.  Scheduled Meds:  (feeding supplement) PROSource Plus  30 mL Oral BID BM   dicyclomine  20 mg Oral TID AC & HS   diltiazem  120 mg Oral Daily   feeding supplement  1 Container Oral TID BM   LORazepam  0.5 mg Intravenous Q6H   multivitamin with minerals  1 tablet Oral Daily   nicotine  14 mg Transdermal Daily   pantoprazole (PROTONIX) IV  40 mg Intravenous Q12H   sertraline  100 mg Oral Daily   sodium chloride flush  3 mL Intravenous Q12H   Continuous Infusions:  lactated ringers with kcl 100 mL/hr at 12/01/20 1505     LOS: 1 day   Time spent: 35 minutes.  Patrecia Pour, MD [facility] [email or web address] 12/01/2020, 3:37 PM

## 2020-12-01 NOTE — Op Note (Signed)
[facility] Patient Name: Alejandro Flores Procedure Date : 12/01/2020 MRN: [id number] Attending MD: Ladene Artist , MD Date of Birth: 02/07/1981 CSN: [id number] Age: 40 Admit Type: Inpatient Procedure:                Upper GI endoscopy Indications:              Epigastric abdominal pain, Coffee-ground emesis,                            Nausea, Persistent vomiting Providers:                Pricilla Riffle. Fuller Plan, MD, Vista Lawman, RN, Benetta Spar, Technician Referring MD:             [facility] Medicines:                Monitored Anesthesia Care Complications:            No immediate complications. Estimated Blood Loss:     Estimated blood loss was minimal. Procedure:                Pre-Anesthesia Assessment:                           - Prior to the procedure, a History and Physical                            was performed, and patient medications and                            allergies were reviewed. The patient's tolerance of                            previous anesthesia was also reviewed. The risks                            and benefits of the procedure and the sedation                            options and risks were discussed with the patient.                            All questions were answered, and informed consent                            was obtained. Prior Anticoagulants: The patient has                            taken no previous anticoagulant or antiplatelet                            agents. ASA Grade Assessment: II - A patient with  mild systemic disease. After reviewing the risks                            and benefits, the patient was deemed in                            satisfactory condition to undergo the procedure.                           After obtaining informed consent, the endoscope was                            passed under direct vision. Throughout the                            procedure, the  patient's blood pressure, pulse, and                            oxygen saturations were monitored continuously. The                            GIF-H190 ([id number]) Olympus gastroscope was                            introduced through the mouth, and advanced to the                            second part of duodenum. The upper GI endoscopy was                            accomplished without difficulty. The patient                            tolerated the procedure well. Scope In: Scope Out: Findings:      LA Grade B (one or more mucosal breaks greater than 5 mm, not extending       between the tops of two mucosal folds) esophagitis with no bleeding and       scattered white exudates was found in the mid and distal esophagus.       Biopsies were taken with a cold forceps for histology.      The exam of the esophagus was otherwise normal.      A few dispersed small erosions with no bleeding and no stigmata of       recent bleeding were found in the gastric body and in the gastric       antrum. Biopsies were taken with a cold forceps for histology.      The exam of the stomach was otherwise normal.      The duodenal bulb and second portion of the duodenum were normal. Impression:               - LA Grade B esophagitis with no bleeding.                            Esophagitis could be secondary to vomiting.  Biopsied.                           - Mild erosive gastropathy with no bleeding and no                            stigmata of recent bleeding. Biopsied.                           - Normal duodenal bulb and second portion of the                            duodenum. Recommendation:           - Return patient to hospital ward for ongoing care.                           - Clear liquid diet today.                           - Follow antireflux measures including HOB elevated                            > 30 degrees at all times.                           - Continue  present medications.                           - Discontinue Pepcid.                           - Protonix (pantoprazole) 40 mg IV q12h.                           - Increase Bentyl to 20 mg po qid (ac & hs).                           - Suggest changing Ativan to scheduled dosing for                            anxiety, N/V - defer to primary service.                           - No aspirin, ibuprofen, naproxen, or other                            non-steroidal anti-inflammatory drugs.                           - Await pathology results. Procedure Code(s):        --- Professional ---                           [phone], Esophagogastroduodenoscopy, flexible,  transoral; with biopsy, single or multiple Diagnosis Code(s):        --- Professional ---                           K21.00, Gastro-esophageal reflux disease with                            esophagitis, without bleeding                           K31.89, Other diseases of stomach and duodenum                           R10.13, Epigastric pain                           K92.0, Hematemesis                           R11.0, Nausea                           K82.06, Cyclical vomiting syndrome unrelated to                            migraine CPT copyright 2019 American Medical Association. All rights reserved. The codes documented in this report are preliminary and upon coder review may  be revised to meet current compliance requirements. Ladene Artist, MD 12/01/2020 8:12:51 AM This report has been signed electronically. Number of Addenda: 0

## 2020-12-01 NOTE — Interval H&P Note (Signed)
History and Physical Interval Note:  12/01/2020 7:41 AM  Alejandro Flores  has presented today for surgery, with the diagnosis of nausea, vomiting, upper abdominal pain, blood in emesis.  The various methods of treatment have been discussed with the patient and family. After consideration of risks, benefits and other options for treatment, the patient has consented to  Procedure(s): ESOPHAGOGASTRODUODENOSCOPY (EGD) WITH PROPOFOL (N/A) as a surgical intervention.  The patient's history has been reviewed, patient examined, no change in status, stable for surgery.  I have reviewed the patient's chart and labs.  Questions were answered to the patient's satisfaction.     Pricilla Riffle. Fuller Plan

## 2020-12-01 NOTE — Transfer of Care (Signed)
Immediate Anesthesia Transfer of Care Note  Patient: Alejandro Flores  Procedure(s) Performed: ESOPHAGOGASTRODUODENOSCOPY (EGD) WITH PROPOFOL BIOPSY  Patient Location: PACU  Anesthesia Type:MAC  Level of Consciousness: awake, alert  and oriented  Airway & Oxygen Therapy: Patient Spontanous Breathing  Post-op Assessment: Report given to RN and Post -op Vital signs reviewed and stable  Post vital signs: Reviewed and stable  Last Vitals:  Vitals Value Taken Time  BP 137/83 12/01/20 0811  Temp    Pulse 64 12/01/20 0818  Resp 17 12/01/20 0818  SpO2 97 % 12/01/20 0818  Vitals shown include unvalidated device data.  Last Pain:  Vitals:   12/01/20 0710  TempSrc: Oral  PainSc: 0-No pain         Complications: No notable events documented.

## 2020-12-01 NOTE — Anesthesia Postprocedure Evaluation (Signed)
Anesthesia Post Note  Patient: Alejandro Flores  Procedure(s) Performed: ESOPHAGOGASTRODUODENOSCOPY (EGD) WITH PROPOFOL BIOPSY     Patient location during evaluation: Endoscopy Anesthesia Type: MAC Level of consciousness: patient cooperative, oriented and sedated Pain management: pain level controlled Vital Signs Assessment: post-procedure vital signs reviewed and stable Respiratory status: spontaneous breathing, nonlabored ventilation and respiratory function stable Cardiovascular status: blood pressure returned to baseline and stable Postop Assessment: no apparent nausea or vomiting Anesthetic complications: no   No notable events documented.  Last Vitals:  Vitals:   12/01/20 0826 12/01/20 0842  BP: 136/88 138/79  Pulse: 62 68  Resp: 20 13  Temp:  (!) 36.4 C  SpO2: 95% 96%    Last Pain:  Vitals:   12/01/20 0842  TempSrc:   PainSc: 0-No pain                 Diamond Jentz,E. Sabrie Moritz

## 2020-12-01 NOTE — Anesthesia Procedure Notes (Signed)
Procedure Name: MAC Date/Time: 12/01/2020 8:05 AM Performed by: Eligha Bridegroom, CRNA Pre-anesthesia Checklist: Patient identified, Emergency Drugs available, Suction available, Patient being monitored and Timeout performed Patient Re-evaluated:Patient Re-evaluated prior to induction Oxygen Delivery Method: Nasal cannula Preoxygenation: Pre-oxygenation with 100% oxygen Induction Type: IV induction

## 2020-12-02 LAB — BASIC METABOLIC PANEL
Anion gap: 9 (ref 5–15)
BUN: 8 mg/dL (ref 6–20)
CO2: 24 mmol/L (ref 22–32)
Calcium: 8.2 mg/dL — ABNORMAL LOW (ref 8.9–10.3)
Chloride: 104 mmol/L (ref 98–111)
Creatinine, Ser: 0.78 mg/dL (ref 0.61–1.24)
GFR, Estimated: >60 mL/min (ref >60)
Glucose, Bld: 105 mg/dL — ABNORMAL HIGH (ref 70–99)
Potassium: 2.7 mmol/L — CL (ref 3.5–5.1)
Sodium: 137 mmol/L (ref 135–145)

## 2020-12-02 MED ORDER — POTASSIUM CHLORIDE 10 MEQ/100ML IV SOLN
10.0000 meq | INTRAVENOUS | Status: AC
Start: 2020-12-02 — End: 2020-12-02
  Administered 2020-12-02 (×6): 10 meq via INTRAVENOUS
  Filled 2020-12-02 (×6): qty 100

## 2020-12-02 NOTE — Progress Notes (Addendum)
    Progress Note   Subjective  N/V have improved. Tolerated nutritional supplements. Feels better.    Objective  Vital signs in last 24 hours: Temp:  [97 F (36.1 C)-99 F (37.2 C)] 98.3 F (36.8 C) (06/12 0431) Pulse Rate:  [62-75] 66 (06/12 0431) Resp:  [13-20] 16 (06/12 0431) BP: (136-146)/(79-89) 143/89 (06/12 0431) SpO2:  [95 %-100 %] 100 % (06/12 0431) Last BM Date: 12/01/20  General: Alert, well-developed, in NAD Heart:  Regular rate and rhythm; no murmurs Chest: Clear to ascultation bilaterally Abdomen:  Soft, nontender and nondistended. Normal bowel sounds, without guarding, and without rebound.   Extremities:  Without edema. Neurologic:  Alert and  oriented x4; grossly normal neurologically. Psych:  Alert and cooperative. Anxious.  Intake/Output from previous day: 06/11 0701 - 06/12 0700 In: 1210 [P.O.:960; I.V.:250] Out: 1975 [Urine:1975] Intake/Output this shift: No intake/output data recorded.  Lab Results: Recent Labs    11/30/20 0152 12/01/20 0025  WBC 13.3* 10.4  HGB 15.2 14.3  HCT 43.7 40.9  PLT 228 224   BMET Recent Labs    11/30/20 0152 12/01/20 0025 12/02/20 0223  NA 138 139 137  K 3.7 3.0* 2.7*  CL 101 104 104  CO2 25 24 24   GLUCOSE 99 74 105*  BUN 8 10 8   CREATININE 0.91 0.79 0.78  CALCIUM 8.7* 8.3* 8.2*   LFT Recent Labs    12/01/20 0025  PROT 5.9*  ALBUMIN 3.4*  AST 12*  ALT 19  ALKPHOS 51  BILITOT 1.0      Assessment & Recommendations  Persistent N/V improved. Erosive esophagitis, erosive gastritis. Continue pantoprazole 40 mg bid, dicyclomine 20 mg po ac & hs and Zofran 4 mg tid as inpatient and as outpatient at discharge. Advance diet to full liquids. Follow antireflux measures. If full liquids tolerated today change to PO meds and hopefully ready for discharge tomorrow. Outpatient GI follow up in 1 month.  Anxiety, panic disorder are contributing to his symptoms. Anxiolytics appear to be helping. Per primary  service.  Hypokalemia. Per primary service.  GI signing off. Please contact as needed.     LOS: 2 days   Norberto Sorenson T. Fuller Plan MD 12/02/2020, 7:28 AM [phone]

## 2020-12-02 NOTE — Progress Notes (Signed)
PROGRESS NOTE  Alejandro Flores  GEZ:[id number] DOB: [birth year] DOA: 11/29/2020 PCP: Mack Hook, MD   Brief Narrative: Alejandro Flores is a 40 y.o. male with a history of GAD with agoraphobia, panic disorder, depression, EtOH abuse in remission, alcoholic pancreatitis w/hx necrosis, tobacco use, obesity, and SVT (?possibly AFib) who presented to the ED for the third time in 3 days on 6/9 with intractable nausea and vomiting. Despite largely reassuring work up, he was brought in for observation with IV fluids and ativan, in lieu of typical antiemetics due to prolonged QTc. Symptoms have continued. EGD revealed gastropathy and esophagitis.   Assessment & Plan: Principal Problem:   Intractable vomiting with nausea Active Problems:   Anxiety disorder   Depression   Tourette's   Prolonged QT interval   Class 2 obesity due to excess calories with body mass index (BMI) of 35.0 to 35.9 in adult   Marijuana abuse   Tobacco dependence   Intractable nausea and vomiting   Abdominal pain, epigastric   Coffee ground emesis  Intractable nausea and vomiting: ?CVS vs. cannabinoid hyperemesis with +THC on arrival. Unremarkable CT abd/pelvis and only chronic hepatic steatosis on U/S. LFTs ok, lipase wnl (hx pancreatitis). TBili normalized. - Avoid THC - Continue bentyle 20mg  po TIDWC/HS, ativan for anxiety and nausea. QTc improved, ok to use zofran prn which can alleviate symptoms at home as well.  - F/u with GI in 1 month.   Grade B erosive esophagitis and mild erosive gastropathy: On EGD 6/11.  - PPI IV q12h - Reflux precautions - Follow up biopsies.  - Avoid ASA, NSAIDs.   Hematemesis: No stigmata or source of bleeding on EGD 6/11. Hgb remains wnl.   GAD w/agoraphobia, depression, panic disorder, tourette's: Symptoms exacerbated by recent family losses and health conditions. - Continue sertraline, recommend continued outpatient follow up.   Leukocytosis: Suspected to be reactive in the absence of  infectious nidus. Trending downward consistently without antimicrobial Tx and normalized 6/11.   Prolonged QT interval and history of SVT:  - QTc improving, down to 472msec, stable. No need to continue ECGs with ongoing telemetry.  - Optimize Mg, K.  - Continue diltiazem  Hypokalemia: Worsening despite significant supplementation. Very little intake and continued losses.  - Continue LR w/23mEq KCL @ 100cc/hr, add 6 runs of IV K and recheck in AM with Mg.   Tobacco use:  - Nicotine patch, cessation counseling.   Marijuana use:  - Cessation counseling  History of alcohol abuse: In remission.  Obesity: Estimated body mass index is 35.74 kg/m as calculated from the following:   Height as of this encounter: 5\' 10"  (1.778 m).   Weight as of this encounter: 113 kg.  DVT prophylaxis: SCDs Code Status: Full Family Communication: None at bedside Disposition Plan:  Status is: Inpatient  Remains inpatient appropriate because:IV treatments appropriate due to intensity of illness or inability to take PO  Dispo: The patient is from: Home              Anticipated d/c is to: Home likely 6/13 if symptoms controlled              Patient currently is not medically stable to d/c.   Difficult to place patient No  Consultants:  [facility]  Procedures:  EGD 12/01/2020 Dr. Fuller Plan: Impression:        - LA Grade B esophagitis with no bleeding.  Esophagitis could be secondary to vomiting.                           Biopsied.                           - Mild erosive gastropathy with no bleeding and no                           stigmata of recent bleeding. Biopsied.                           - Normal duodenal bulb and second portion of the                           duodenum. Recommendation: - Return patient to hospital ward for ongoing care.                           - Clear liquid diet today.                           - Follow antireflux measures including HOB elevated                            > 30 degrees at all times.                           - Continue present medications.                           - Discontinue Pepcid.                           - Protonix (pantoprazole) 40 mg IV q12h.                           - Increase Bentyl to 20 mg po qid (ac & hs).                           - Suggest changing Ativan to scheduled dosing for                           anxiety, N/V - defer to primary service.                           - No aspirin, ibuprofen, naproxen, or other                           non-steroidal anti-inflammatory drugs.                           - Await pathology results.  Antimicrobials: None   Subjective: Still not taking enough in orally, nauseated but improved.   Objective: Vitals:   12/01/20 1632 12/01/20 2006 12/02/20 0431 12/02/20 1349  BP: (!) 146/80 137/82 (!) 143/89 131/84  Pulse: 66 65 66 71  Resp: 20 18 16 16   Temp: 98.2 F (36.8 C) 99 F (37.2 C) 98.3 F (36.8 C) 97.6 F (36.4 C)  TempSrc: Oral Oral Oral Axillary  SpO2: 97% 98% 100% 96%  Weight:      Height:        Intake/Output Summary (Last 24 hours) at 12/02/2020 1810 Last data filed at 12/02/2020 1805 Gross per 24 hour  Intake 1658.31 ml  Output 1775 ml  Net -116.69 ml   Filed Weights   11/29/20 0535  Weight: 113 kg   Gen: 40 y.o. male in no distress Pulm: Nonlabored breathing room air. Clear. CV: Regular rate and rhythm. No murmur, rub, or gallop. No JVD, no dependent edema. GI: Abdomen soft, non-tender, non-distended, with normoactive bowel sounds.  Ext: Warm, no deformities Skin: No rashes, lesions or ulcers on visualized skin. Neuro: Alert and oriented. No focal neurological deficits. Psych: Judgement and insight appear fair. Mood euthymic & affect congruent. Behavior is appropriate.    Data Reviewed: I have personally reviewed following labs and imaging studies  CBC: Recent Labs  Lab 11/26/20 0620 11/28/20 1036 11/29/20 0233 11/30/20 0152  12/01/20 0025  WBC 21.7* 12.7* 16.4* 13.3* 10.4  NEUTROABS 18.4* 8.6*  --   --   --   HGB 16.2 15.8 15.6 15.2 14.3  HCT 46.2 44.8 45.0 43.7 40.9  MCV 86.0 84.1 85.2 84.7 85.2  PLT 234 245 270 228 616   Basic Metabolic Panel: Recent Labs  Lab 11/28/20 1036 11/29/20 0233 11/30/20 0152 12/01/20 0025 12/02/20 0223  NA 137 137 138 139 137  K 3.0* 3.5 3.7 3.0* 2.7*  CL 103 103 101 104 104  CO2 25 21* 25 24 24   GLUCOSE 107* 105* 99 74 105*  BUN 11 11 8 10 8   CREATININE 0.76 0.89 0.91 0.79 0.78  CALCIUM 8.8* 8.9 8.7* 8.3* 8.2*  MG  --  2.0  --  2.2  --    GFR: Estimated Creatinine Clearance: 153 mL/min (by C-G formula based on SCr of 0.78 mg/dL). Liver Function Tests: Recent Labs  Lab 11/26/20 0620 11/28/20 1036 11/29/20 0233 12/01/20 0025  AST 30 23 30  12*  ALT 42 29 29 19   ALKPHOS 72 64 60 51  BILITOT 0.8 1.0 2.0* 1.0  PROT 8.6* 7.6 7.3 5.9*  ALBUMIN 5.1* 4.4 4.3 3.4*   Recent Labs  Lab 11/26/20 0620 11/28/20 1036 11/29/20 0233  LIPASE 27 31 32   No results for input(s): AMMONIA in the last 168 hours. Coagulation Profile: No results for input(s): INR, PROTIME in the last 168 hours. Cardiac Enzymes: No results for input(s): CKTOTAL, CKMB, CKMBINDEX, TROPONINI in the last 168 hours. BNP (last 3 results) No results for input(s): PROBNP in the last 8760 hours. HbA1C: No results for input(s): HGBA1C in the last 72 hours. CBG: No results for input(s): GLUCAP in the last 168 hours. Lipid Profile: No results for input(s): CHOL, HDL, LDLCALC, TRIG, CHOLHDL, LDLDIRECT in the last 72 hours. Thyroid Function Tests: No results for input(s): TSH, T4TOTAL, FREET4, T3FREE, THYROIDAB in the last 72 hours.  Anemia Panel: No results for input(s): VITAMINB12, FOLATE, FERRITIN, TIBC, IRON, RETICCTPCT in the last 72 hours. Urine analysis:    Component Value Date/Time   COLORURINE YELLOW 11/29/2020 0428   APPEARANCEUR CLEAR 11/29/2020 0428   LABSPEC 1.024 11/29/2020 0428    PHURINE 5.0 11/29/2020 0428   GLUCOSEU NEGATIVE 11/29/2020 0428   HGBUR SMALL (A) 11/29/2020 0428   BILIRUBINUR NEGATIVE 11/29/2020 0428  KETONESUR 80 (A) 11/29/2020 0428   PROTEINUR 30 (A) 11/29/2020 0428   NITRITE NEGATIVE 11/29/2020 0428   LEUKOCYTESUR NEGATIVE 11/29/2020 0428   Recent Results (from the past 240 hour(s))  Resp Panel by RT-PCR (Flu A&B, Covid) Nasopharyngeal Swab     Status: None   Collection Time: 11/26/20  8:14 AM   Specimen: Nasopharyngeal Swab; Nasopharyngeal(NP) swabs in vial transport medium  Result Value Ref Range Status   SARS Coronavirus 2 by RT PCR NEGATIVE NEGATIVE Final    Comment: (NOTE) SARS-CoV-2 target nucleic acids are NOT DETECTED.  The SARS-CoV-2 RNA is generally detectable in upper respiratory specimens during the acute phase of infection. The lowest concentration of SARS-CoV-2 viral copies this assay can detect is 138 copies/mL. A negative result does not preclude SARS-Cov-2 infection and should not be used as the sole basis for treatment or other patient management decisions. A negative result may occur with  improper specimen collection/handling, submission of specimen other than nasopharyngeal swab, presence of viral mutation(s) within the areas targeted by this assay, and inadequate number of viral copies(<138 copies/mL). A negative result must be combined with clinical observations, patient history, and epidemiological information. The expected result is Negative.  Fact Sheet for Patients:  EntrepreneurPulse.com.au  Fact Sheet for Healthcare Providers:  IncredibleEmployment.be  This test is no t yet approved or cleared by the [place] FDA and  has been authorized for detection and/or diagnosis of SARS-CoV-2 by FDA under an Emergency Use Authorization (EUA). This EUA will remain  in effect (meaning this test can be used) for the duration of the COVID-19 declaration under Section 564(b)(1)  of the Act, 21 U.S.C.section 360bbb-3(b)(1), unless the authorization is terminated  or revoked sooner.       Influenza A by PCR NEGATIVE NEGATIVE Final   Influenza B by PCR NEGATIVE NEGATIVE Final    Comment: (NOTE) The Xpert Xpress SARS-CoV-2/FLU/RSV plus assay is intended as an aid in the diagnosis of influenza from Nasopharyngeal swab specimens and should not be used as a sole basis for treatment. Nasal washings and aspirates are unacceptable for Xpert Xpress SARS-CoV-2/FLU/RSV testing.  Fact Sheet for Patients: EntrepreneurPulse.com.au  Fact Sheet for Healthcare Providers: IncredibleEmployment.be  This test is not yet approved or cleared by the [place] FDA and has been authorized for detection and/or diagnosis of SARS-CoV-2 by FDA under an Emergency Use Authorization (EUA). This EUA will remain in effect (meaning this test can be used) for the duration of the COVID-19 declaration under Section 564(b)(1) of the Act, 21 U.S.C. section 360bbb-3(b)(1), unless the authorization is terminated or revoked.  Performed at [facility], [place] [address], [place], [place] [address]   Resp Panel by RT-PCR (Flu A&B, Covid) Nasopharyngeal Swab     Status: None   Collection Time: 11/29/20 11:35 AM   Specimen: Nasopharyngeal Swab; Nasopharyngeal(NP) swabs in vial transport medium  Result Value Ref Range Status   SARS Coronavirus 2 by RT PCR NEGATIVE NEGATIVE Final    Comment: (NOTE) SARS-CoV-2 target nucleic acids are NOT DETECTED.  The SARS-CoV-2 RNA is generally detectable in upper respiratory specimens during the acute phase of infection. The lowest concentration of SARS-CoV-2 viral copies this assay can detect is 138 copies/mL. A negative result does not preclude SARS-Cov-2 infection and should not be used as the sole basis for treatment or other patient management decisions. A negative result may occur with  improper  specimen collection/handling, submission of specimen other than nasopharyngeal swab, presence of viral mutation(s) within  the areas targeted by this assay, and inadequate number of viral copies(<138 copies/mL). A negative result must be combined with clinical observations, patient history, and epidemiological information. The expected result is Negative.  Fact Sheet for Patients:  EntrepreneurPulse.com.au  Fact Sheet for Healthcare Providers:  IncredibleEmployment.be  This test is no t yet approved or cleared by the [place] FDA and  has been authorized for detection and/or diagnosis of SARS-CoV-2 by FDA under an Emergency Use Authorization (EUA). This EUA will remain  in effect (meaning this test can be used) for the duration of the COVID-19 declaration under Section 564(b)(1) of the Act, 21 U.S.C.section 360bbb-3(b)(1), unless the authorization is terminated  or revoked sooner.       Influenza A by PCR NEGATIVE NEGATIVE Final   Influenza B by PCR NEGATIVE NEGATIVE Final    Comment: (NOTE) The Xpert Xpress SARS-CoV-2/FLU/RSV plus assay is intended as an aid in the diagnosis of influenza from Nasopharyngeal swab specimens and should not be used as a sole basis for treatment. Nasal washings and aspirates are unacceptable for Xpert Xpress SARS-CoV-2/FLU/RSV testing.  Fact Sheet for Patients: EntrepreneurPulse.com.au  Fact Sheet for Healthcare Providers: IncredibleEmployment.be  This test is not yet approved or cleared by the [place] FDA and has been authorized for detection and/or diagnosis of SARS-CoV-2 by FDA under an Emergency Use Authorization (EUA). This EUA will remain in effect (meaning this test can be used) for the duration of the COVID-19 declaration under Section 564(b)(1) of the Act, 21 U.S.C. section 360bbb-3(b)(1), unless the authorization is terminated or revoked.  Performed at  [facility], [place] [address], [place], [place] [address]       Radiology Studies: No results found.  Scheduled Meds:  (feeding supplement) PROSource Plus  30 mL Oral BID BM   dicyclomine  20 mg Oral TID AC & HS   diltiazem  120 mg Oral Daily   feeding supplement  1 Container Oral TID BM   LORazepam  0.5 mg Intravenous Q6H   multivitamin with minerals  1 tablet Oral Daily   nicotine  14 mg Transdermal Daily   pantoprazole (PROTONIX) IV  40 mg Intravenous Q12H   sertraline  100 mg Oral Daily   sodium chloride flush  3 mL Intravenous Q12H   Continuous Infusions:  lactated ringers with kcl 100 mL/hr at 12/02/20 0628     LOS: 2 days   Time spent: 35 minutes.  Patrecia Pour, MD [facility] [email or web address] 12/02/2020, 6:10 PM

## 2020-12-02 NOTE — Plan of Care (Signed)
°  Problem: Education: °Goal: Knowledge of General Education information will improve °Description: Including pain rating scale, medication(s)/side effects and non-pharmacologic comfort measures °Outcome: Progressing °  °Problem: Health Behavior/Discharge Planning: °Goal: Ability to manage health-related needs will improve °Outcome: Progressing °  °Problem: Clinical Measurements: °Goal: Ability to maintain clinical measurements within normal limits will improve °Outcome: Progressing °Goal: Respiratory complications will improve °Outcome: Progressing °Goal: Cardiovascular complication will be avoided °Outcome: Progressing °  °Problem: Activity: °Goal: Risk for activity intolerance will decrease °Outcome: Progressing °  °Problem: Nutrition: °Goal: Adequate nutrition will be maintained °Outcome: Progressing °  °Problem: Elimination: °Goal: Will not experience complications related to bowel motility °Outcome: Progressing °Goal: Will not experience complications related to urinary retention °Outcome: Progressing °  °Problem: Pain Managment: °Goal: General experience of comfort will improve °Outcome: Progressing °  °Problem: Safety: °Goal: Ability to remain free from injury will improve °Outcome: Progressing °  °Problem: Skin Integrity: °Goal: Risk for impaired skin integrity will decrease °Outcome: Progressing °  °

## 2020-12-03 ENCOUNTER — Encounter (HOSPITAL_COMMUNITY): Payer: Self-pay | Admitting: Gastroenterology

## 2020-12-03 LAB — BASIC METABOLIC PANEL
Anion gap: 10 (ref 5–15)
BUN: 8 mg/dL (ref 6–20)
CO2: 23 mmol/L (ref 22–32)
Calcium: 9 mg/dL (ref 8.9–10.3)
Chloride: 106 mmol/L (ref 98–111)
Creatinine, Ser: 0.81 mg/dL (ref 0.61–1.24)
GFR, Estimated: >60 mL/min (ref >60)
Glucose, Bld: 106 mg/dL — ABNORMAL HIGH (ref 70–99)
Potassium: 3.3 mmol/L — ABNORMAL LOW (ref 3.5–5.1)
Sodium: 139 mmol/L (ref 135–145)

## 2020-12-03 LAB — MAGNESIUM: Magnesium: 2.2 mg/dL (ref 1.7–2.4)

## 2020-12-03 MED ORDER — POTASSIUM CHLORIDE 10 MEQ/100ML IV SOLN
10.0000 meq | INTRAVENOUS | Status: AC
  Administered 2020-12-03 (×4): 10 meq via INTRAVENOUS
  Filled 2020-12-03 (×4): qty 100

## 2020-12-03 MED ORDER — POTASSIUM CHLORIDE CRYS ER 20 MEQ PO TBCR
40.0000 meq | EXTENDED_RELEASE_TABLET | Freq: Once | ORAL | Status: AC
  Administered 2020-12-03: 40 meq via ORAL
  Filled 2020-12-03: qty 2

## 2020-12-03 NOTE — Plan of Care (Signed)

## 2020-12-03 NOTE — Progress Notes (Signed)
PROGRESS NOTE  Alejandro Flores  KWI:[id number] DOB: 1981/02/07 DOA: 11/29/2020 PCP: Mack Hook, MD   Brief Narrative: Alejandro Flores is a 40 y.o. male with a history of GAD with agoraphobia, panic disorder, depression, EtOH abuse in remission, alcoholic pancreatitis w/hx necrosis, tobacco use, obesity, and SVT (?possibly AFib) who presented to the ED for the third time in 3 days on 6/9 with intractable nausea and vomiting. Despite largely reassuring work up, he was brought in for observation with IV fluids and ativan, in lieu of typical antiemetics due to prolonged QTc. Symptoms have continued. EGD revealed gastropathy and esophagitis.   Assessment & Plan: Principal Problem:   Intractable vomiting with nausea Active Problems:   Anxiety disorder   Depression   Tourette's   Prolonged QT interval   Class 2 obesity due to excess calories with body mass index (BMI) of 35.0 to 35.9 in adult   Marijuana abuse   Tobacco dependence   Intractable nausea and vomiting   Abdominal pain, epigastric   Coffee ground emesis  Intractable nausea and vomiting: ?CVS vs. cannabinoid hyperemesis with +THC on arrival. Unremarkable CT abd/pelvis and only chronic hepatic steatosis on U/S. LFTs ok, lipase wnl (hx pancreatitis). TBili normalized. With diarrhea, ?IBS-D - Avoid THC - Continue bentyl 20mg  po TIDWC/HS, ativan for anxiety and nausea. QTc improving allowing antiemetic liberalization. - F/u with GI in 1 month.   Grade B erosive esophagitis and mild erosive gastropathy: On EGD 6/11.  - Continue PPI - Reflux precautions - Follow up biopsies. Labeled as collected, pending today. - Avoid ASA, NSAIDs.   Hematemesis: No stigmata or source of bleeding on EGD 6/11. Hgb remains wnl.   GAD w/agoraphobia, depression, panic disorder, tourette's: Symptoms exacerbated by recent family losses and health conditions. 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  - Continue sertraline,  recommend continued outpatient follow up.  - Anxiolytic  Leukocytosis: Suspected to be reactive in the absence of infectious nidus. Trending downward consistently without antimicrobial Tx and normalized 6/11.   Prolonged QT interval and history of SVT: QTc on telemetry personally reviewed is 471msec.  - QTc improving with improvement in metabolic derangements.  - Optimize Mg, K. Will dial in home dose over next 24 hours. Likely 67mEq/day while having diarrhea, then 59mEq/day. - Continue diltiazem  Hypokalemia: Requiring significant supplementation but is improving.  - Repeat 63mEq po now and 4 runs of IV K. Continue LR w/28mEq KCL @ 100cc/hr. - Mg remains wnl.  Tobacco use:  - Nicotine patch, cessation counseling.   Marijuana use:  - Cessation counseling  History of alcohol abuse: In remission.  Obesity: Estimated body mass index is 35.74 kg/m as calculated from the following:   Height as of this encounter: 5\' 10"  (1.778 m).   Weight as of this encounter: 113 kg.  DVT prophylaxis: SCDs Code Status: Full Family Communication: None at bedside Disposition Plan:  Status is: Inpatient  Remains inpatient appropriate because:IV treatments appropriate due to intensity of illness or inability to take PO. Pt's symptoms are improving with treatment, but still despite max effort, not able to take in enough throughout the day today.   Dispo: The patient is from: Home              Anticipated d/c is to: Home likely 6/14              Patient currently is not medically stable to d/c.   Difficult to place patient No  Consultants:  [facility]  Procedures:  EGD 12/01/2020 Dr. Fuller Plan: Impression:        - LA Grade B esophagitis with no bleeding.                           Esophagitis could be secondary to vomiting.                           Biopsied.                           - Mild erosive gastropathy with no bleeding and no                           stigmata of recent bleeding.  Biopsied.                           - Normal duodenal bulb and second portion of the                           duodenum. Recommendation: - Return patient to hospital ward for ongoing care.                           - Clear liquid diet today.                           - Follow antireflux measures including HOB elevated                           > 30 degrees at all times.                           - Continue present medications.                           - Discontinue Pepcid.                           - Protonix (pantoprazole) 40 mg IV q12h.                           - Increase Bentyl to 20 mg po qid (ac & hs).                           - Suggest changing Ativan to scheduled dosing for                           anxiety, N/V - defer to primary service.                           - No aspirin, ibuprofen, naproxen, or other                           non-steroidal anti-inflammatory drugs.                           -  Await pathology results.  Antimicrobials: None   Subjective: Improved symptoms overall. No vomiting reported but having diarrhea and nausea limiting intake. When reassessed this PM he had taken 19 ounces of fluid, no solids in.   Objective: Vitals:   12/02/20 1349 12/02/20 2106 12/03/20 0518 12/03/20 1416  BP: 131/84 131/69 136/79 123/82  Pulse: 71 65 66 65  Resp: 16 16 17    Temp: 97.6 F (36.4 C) 98.5 F (36.9 C) 98.4 F (36.9 C) 98.7 F (37.1 C)  TempSrc: Axillary Oral Oral Oral  SpO2: 96% 94% 98% 98%  Weight:      Height:        Intake/Output Summary (Last 24 hours) at 12/03/2020 1648 Last data filed at 12/03/2020 1434 Gross per 24 hour  Intake 2498.31 ml  Output 1275 ml  Net 1223.31 ml   Filed Weights   11/29/20 0535  Weight: 113 kg   Gen: 40 y.o. male in no distress Pulm: Nonlabored breathing room air. Clear. CV: Regular rate and rhythm. No murmur, rub, or gallop. No JVD, no dependent edema. GI: Abdomen soft, non-tender, non-distended, with normoactive  bowel sounds.  Ext: Warm, no deformities Skin: No rashes, lesions or ulcers on visualized skin. Neuro: Alert and oriented. No focal neurological deficits. Psych: Judgement and insight appear normal. Calm. Anxious.  Data Reviewed: I have personally reviewed following labs and imaging studies  CBC: Recent Labs  Lab 11/28/20 1036 11/29/20 0233 11/30/20 0152 12/01/20 0025  WBC 12.7* 16.4* 13.3* 10.4  NEUTROABS 8.6*  --   --   --   HGB 15.8 15.6 15.2 14.3  HCT 44.8 45.0 43.7 40.9  MCV 84.1 85.2 84.7 85.2  PLT 245 270 228 275   Basic Metabolic Panel: Recent Labs  Lab 11/29/20 0233 11/30/20 0152 12/01/20 0025 12/02/20 0223 12/03/20 1501  NA 137 138 139 137 139  K 3.5 3.7 3.0* 2.7* 3.3*  CL 103 101 104 104 106  CO2 21* 25 24 24 23   GLUCOSE 105* 99 74 105* 106*  BUN 11 8 10 8 8   CREATININE 0.89 0.91 0.79 0.78 0.81  CALCIUM 8.9 8.7* 8.3* 8.2* 9.0  MG 2.0  --  2.2  --  2.2   Liver Function Tests: Recent Labs  Lab 11/28/20 1036 11/29/20 0233 12/01/20 0025  AST 23 30 12*  ALT 29 29 19   ALKPHOS 64 60 51  BILITOT 1.0 2.0* 1.0  PROT 7.6 7.3 5.9*  ALBUMIN 4.4 4.3 3.4*   Recent Labs  Lab 11/28/20 1036 11/29/20 0233  LIPASE 31 32   Urine analysis:    Component Value Date/Time   COLORURINE YELLOW 11/29/2020 0428   APPEARANCEUR CLEAR 11/29/2020 0428   LABSPEC 1.024 11/29/2020 0428   PHURINE 5.0 11/29/2020 0428   GLUCOSEU NEGATIVE 11/29/2020 0428   HGBUR SMALL (A) 11/29/2020 0428   BILIRUBINUR NEGATIVE 11/29/2020 0428   KETONESUR 80 (A) 11/29/2020 0428   PROTEINUR 30 (A) 11/29/2020 0428   NITRITE NEGATIVE 11/29/2020 0428   LEUKOCYTESUR NEGATIVE 11/29/2020 0428   Recent Results (from the past 240 hour(s))  Resp Panel by RT-PCR (Flu A&B, Covid) Nasopharyngeal Swab     Status: None   Collection Time: 11/26/20  8:14 AM   Specimen: Nasopharyngeal Swab; Nasopharyngeal(NP) swabs in vial transport medium  Result Value Ref Range Status   SARS Coronavirus 2 by RT PCR  NEGATIVE NEGATIVE Final    Comment: (NOTE) SARS-CoV-2 target nucleic acids are NOT DETECTED.  The SARS-CoV-2 RNA is generally detectable in upper  respiratory specimens during the acute phase of infection. The lowest concentration of SARS-CoV-2 viral copies this assay can detect is 138 copies/mL. A negative result does not preclude SARS-Cov-2 infection and should not be used as the sole basis for treatment or other patient management decisions. A negative result may occur with  improper specimen collection/handling, submission of specimen other than nasopharyngeal swab, presence of viral mutation(s) within the areas targeted by this assay, and inadequate number of viral copies(<138 copies/mL). A negative result must be combined with clinical observations, patient history, and epidemiological information. The expected result is Negative.  Fact Sheet for Patients:  EntrepreneurPulse.com.au  Fact Sheet for Healthcare Providers:  IncredibleEmployment.be  This test is no t yet approved or cleared by the [place] FDA and  has been authorized for detection and/or diagnosis of SARS-CoV-2 by FDA under an Emergency Use Authorization (EUA). This EUA will remain  in effect (meaning this test can be used) for the duration of the COVID-19 declaration under Section 564(b)(1) of the Act, 21 U.S.C.section 360bbb-3(b)(1), unless the authorization is terminated  or revoked sooner.       Influenza A by PCR NEGATIVE NEGATIVE Final   Influenza B by PCR NEGATIVE NEGATIVE Final    Comment: (NOTE) The Xpert Xpress SARS-CoV-2/FLU/RSV plus assay is intended as an aid in the diagnosis of influenza from Nasopharyngeal swab specimens and should not be used as a sole basis for treatment. Nasal washings and aspirates are unacceptable for Xpert Xpress SARS-CoV-2/FLU/RSV testing.  Fact Sheet for Patients: EntrepreneurPulse.com.au  Fact Sheet for  Healthcare Providers: IncredibleEmployment.be  This test is not yet approved or cleared by the [place] FDA and has been authorized for detection and/or diagnosis of SARS-CoV-2 by FDA under an Emergency Use Authorization (EUA). This EUA will remain in effect (meaning this test can be used) for the duration of the COVID-19 declaration under Section 564(b)(1) of the Act, 21 U.S.C. section 360bbb-3(b)(1), unless the authorization is terminated or revoked.  Performed at [facility], [place] [address], [place], [place], [place] [address]   Resp Panel by RT-PCR (Flu A&B, Covid) Nasopharyngeal Swab     Status: None   Collection Time: 11/29/20 11:35 AM   Specimen: Nasopharyngeal Swab; Nasopharyngeal(NP) swabs in vial transport medium  Result Value Ref Range Status   SARS Coronavirus 2 by RT PCR NEGATIVE NEGATIVE Final    Comment: (NOTE) SARS-CoV-2 target nucleic acids are NOT DETECTED.  The SARS-CoV-2 RNA is generally detectable in upper respiratory specimens during the acute phase of infection. The lowest concentration of SARS-CoV-2 viral copies this assay can detect is 138 copies/mL. A negative result does not preclude SARS-Cov-2 infection and should not be used as the sole basis for treatment or other patient management decisions. A negative result may occur with  improper specimen collection/handling, submission of specimen other than nasopharyngeal swab, presence of viral mutation(s) within the areas targeted by this assay, and inadequate number of viral copies(<138 copies/mL). A negative result must be combined with clinical observations, patient history, and epidemiological information. The expected result is Negative.  Fact Sheet for Patients:  EntrepreneurPulse.com.au  Fact Sheet for Healthcare Providers:  IncredibleEmployment.be  This test is no t yet approved or cleared by the [place] FDA and  has  been authorized for detection and/or diagnosis of SARS-CoV-2 by FDA under an Emergency Use Authorization (EUA). This EUA will remain  in effect (meaning this test can be used) for the duration of the COVID-19 declaration under Section 564(b)(1) of the  Act, 21 U.S.C.section 360bbb-3(b)(1), unless the authorization is terminated  or revoked sooner.       Influenza A by PCR NEGATIVE NEGATIVE Final   Influenza B by PCR NEGATIVE NEGATIVE Final    Comment: (NOTE) The Xpert Xpress SARS-CoV-2/FLU/RSV plus assay is intended as an aid in the diagnosis of influenza from Nasopharyngeal swab specimens and should not be used as a sole basis for treatment. Nasal washings and aspirates are unacceptable for Xpert Xpress SARS-CoV-2/FLU/RSV testing.  Fact Sheet for Patients: EntrepreneurPulse.com.au  Fact Sheet for Healthcare Providers: IncredibleEmployment.be  This test is not yet approved or cleared by the [place] FDA and has been authorized for detection and/or diagnosis of SARS-CoV-2 by FDA under an Emergency Use Authorization (EUA). This EUA will remain in effect (meaning this test can be used) for the duration of the COVID-19 declaration under Section 564(b)(1) of the Act, 21 U.S.C. section 360bbb-3(b)(1), unless the authorization is terminated or revoked.  Performed at [facility], [place] [address] [place], [place] [address]       Radiology Studies: No results found.  Scheduled Meds:  (feeding supplement) PROSource Plus  30 mL Oral BID BM   dicyclomine  20 mg Oral TID AC & HS   diltiazem  120 mg Oral Daily   feeding supplement  1 Container Oral TID BM   LORazepam  0.5 mg Intravenous Q6H   multivitamin with minerals  1 tablet Oral Daily   nicotine  14 mg Transdermal Daily   pantoprazole (PROTONIX) IV  40 mg Intravenous Q12H   potassium chloride  40 mEq Oral Once   sertraline  100 mg Oral Daily   sodium chloride flush  3 mL  Intravenous Q12H   Continuous Infusions:  lactated ringers with kcl 100 mL/hr at 12/02/20 2238   potassium chloride       LOS: 3 days   Time spent: 25 minutes.  Patrecia Pour, MD [facility] [email or web address] 12/03/2020, 4:48 PM

## 2020-12-04 ENCOUNTER — Encounter: Payer: Self-pay | Admitting: Gastroenterology

## 2020-12-04 LAB — BASIC METABOLIC PANEL
Anion gap: 9 (ref 5–15)
BUN: 8 mg/dL (ref 6–20)
CO2: 21 mmol/L — ABNORMAL LOW (ref 22–32)
Calcium: 8.8 mg/dL — ABNORMAL LOW (ref 8.9–10.3)
Chloride: 106 mmol/L (ref 98–111)
Creatinine, Ser: 0.76 mg/dL (ref 0.61–1.24)
GFR, Estimated: >60 mL/min (ref >60)
Glucose, Bld: 106 mg/dL — ABNORMAL HIGH (ref 70–99)
Potassium: 3.9 mmol/L (ref 3.5–5.1)
Sodium: 136 mmol/L (ref 135–145)

## 2020-12-04 LAB — SURGICAL PATHOLOGY

## 2020-12-04 MED ORDER — POTASSIUM CHLORIDE ER 20 MEQ PO TBCR: 20.0000 meq | EXTENDED_RELEASE_TABLET | Freq: Every day | ORAL | 0 refills | Status: DC

## 2020-12-04 MED ORDER — PANTOPRAZOLE SODIUM 40 MG PO TBEC: 40.0000 mg | DELAYED_RELEASE_TABLET | Freq: Two times a day (BID) | ORAL | 0 refills | Status: DC

## 2020-12-04 MED ORDER — ONDANSETRON 4 MG PO TBDP: 4.0000 mg | ORAL_TABLET | Freq: Three times a day (TID) | ORAL | 0 refills | Status: DC | PRN

## 2020-12-04 MED ORDER — DICYCLOMINE HCL 20 MG PO TABS: 20.0000 mg | ORAL_TABLET | Freq: Three times a day (TID) | ORAL | 0 refills | Status: DC

## 2020-12-04 NOTE — Discharge Summary (Signed)
Physician Discharge Summary  Alejandro Flores PFX:[id number] DOB: January 30, 1981 DOA: 11/29/2020  PCP: Mack Hook, MD  Admit date: 11/29/2020 Discharge date: 12/04/2020  Admitted From: Home Disposition: Home   Recommendations for Outpatient Follow-up:  Follow up with PCP in 1-2 weeks with repeat BMP, magnesium levels. Follow up with GI in 1 month Continue marijuana and tobacco cessation counseling as well as management of anxiety.  Home Health: None Equipment/Devices: None Discharge Condition: Stable CODE STATUS: Full Diet recommendation: Soft, advance as tolerated  Brief/Interim Summary: Alejandro Flores is a 40 y.o. male with a history of GAD with agoraphobia, panic disorder, depression, EtOH abuse in remission, alcoholic pancreatitis w/hx necrosis, tobacco use, obesity, and SVT (?possibly AFib) who presented to the ED for the third time in 3 days on 6/9 with intractable nausea and vomiting. Despite largely reassuring work up, he was brought in for observation with IV fluids and ativan, in lieu of typical antiemetics due to prolonged QTc. Symptoms have continued. EGD revealed gastropathy and esophagitis.  Discharge Diagnoses:  Principal Problem:   Intractable vomiting with nausea Active Problems:   Anxiety disorder   Depression   Tourette's   Prolonged QT interval   Class 2 obesity due to excess calories with body mass index (BMI) of 35.0 to 35.9 in adult   Marijuana abuse   Tobacco dependence   Intractable nausea and vomiting   Abdominal pain, epigastric   Coffee ground emesis  Intractable nausea and vomiting: Unclear underlying pathology, though IBS and gastropathy likely contributing. Suspect cyclic vomiting syndrome vs. cannabinoid hyperemesis with +THC on arrival is also contributing. Unremarkable CT abd/pelvis and only chronic hepatic steatosis on U/S. LFTs ok, lipase wnl (hx pancreatitis). TBili normalized. With diarrhea, ?IBS-D - Avoid THC - Continue bentyl 20mg  po  TIDWC/HS. Continue prn antiemetic now that QT is wnl. - F/u with GI in 1 month.   Grade B erosive esophagitis and mild erosive gastropathy: On EGD 6/11. - Continue PPI per GI recommendations - Reflux precautions - Avoid ASA, NSAIDs. - Biopsies: FINAL MICROSCOPIC DIAGNOSIS:   A. STOMACH, ANTRUM AND BODY, BIOPSY:  - Gastric antral and oxyntic mucosa with no specific histopathologic  changes  - Warthin Starry stain is negative for Helicobacter pylori   B. ESOPHAGUS, BIOPSY:  - Focally active chronic nonspecific esophagitis, see comment  - Negative for increased intraepithelial significance   COMMENT:   B.  Differential diagnosis mainly includes reflux and drug-effect.    Hematemesis: No stigmata or source of bleeding on EGD 6/11. Hgb remains wnl.   GAD w/agoraphobia, depression, panic disorder, tourette's: Symptoms exacerbated by recent family losses and health conditions. 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  - Continue sertraline, recommend continued outpatient follow up   Leukocytosis: Suspected to be reactive in the absence of infectious nidus. Trending downward consistently without antimicrobial Tx and normalized 6/11.   Prolonged QT interval and history of SVT: QTc normalized with improvement in electrolyte deficiencies. 474msec on cardiac monitor on day of discharge.  - Continue diltiazem   Hypokalemia: Resolved with supplementation which is prescribed at discharge.    Tobacco use: - Cessation counseling.   Marijuana use: - Cessation counseling   History of alcohol abuse: In remission.   Obesity: Estimated body mass index is 35.74 kg/m  Discharge Instructions Discharge Instructions     Call MD for:  difficulty breathing, headache or visual disturbances   Complete by: As directed    Call MD for:  persistant dizziness or light-headedness  Complete by: As directed    Call MD for:  persistant nausea and vomiting    Complete by: As directed    Call MD for:  temperature >100.4   Complete by: As directed    Diet full liquid   Complete by: As directed    and advance to soft diet as tolerated      Allergies as of 12/04/2020       Reactions   Morphine Itching, Rash   "hives"        Medication List     STOP taking these medications    metoCLOPramide 10 MG tablet Commonly known as: REGLAN       TAKE these medications    acetaminophen 500 MG tablet Commonly known as: TYLENOL Take 1,000 mg by mouth every 6 (six) hours as needed for mild pain, fever or headache.   dicyclomine 20 MG tablet Commonly known as: BENTYL Take 1 tablet (20 mg total) by mouth 4 (four) times daily -  before meals and at bedtime. What changed: when to take this   diltiazem 120 MG 24 hr capsule Commonly known as: Cardizem CD 1 tab once daily by mouth   LORazepam 2 MG tablet Commonly known as: ATIVAN Take 0.5 mg by mouth every 6 (six) hours as needed for anxiety.   ondansetron 4 MG disintegrating tablet Commonly known as: Zofran ODT Take 1 tablet (4 mg total) by mouth every 8 (eight) hours as needed for nausea or vomiting.   pantoprazole 40 MG tablet Commonly known as: Protonix Take 1 tablet (40 mg total) by mouth 2 (two) times daily.   Potassium Chloride ER 20 MEQ Tbcr Take 20 mEq by mouth daily for 14 days. What changed:  medication strength how much to take   sertraline 100 MG tablet Commonly known as: ZOLOFT Take 100 mg by mouth daily.        Follow-up Information     Mack Hook, MD. Schedule an appointment as soon as possible for a visit in 1 week(s).   Specialty: Internal Medicine Contact information: [place] [address] [phone]         Ladene Artist, MD. Schedule an appointment as soon as possible for a visit in 1 month(s).   Specialty: Gastroenterology Contact information: [address] [place] [address] [phone]                 Allergies  Allergen Reactions   Morphine Itching and Rash    "hives"    Consultations: GI  Procedures/Studies: CT ABDOMEN PELVIS WO CONTRAST  Result Date: 11/29/2020 CLINICAL DATA:  Nausea and vomiting, question complicated pancreatitis EXAM: CT ABDOMEN AND PELVIS WITHOUT CONTRAST TECHNIQUE: Multidetector CT imaging of the abdomen and pelvis was performed following the standard protocol without IV contrast. COMPARISON:  Three days ago FINDINGS: Lower chest:  No contributory findings. Hepatobiliary: Borderline hepatic steatosis with pericholecystic sparing.no evidence of biliary obstruction or stone. Pancreas: Unremarkable. Spleen: Unremarkable. Adrenals/Urinary Tract: Negative adrenals. No hydronephrosis or stone. Unremarkable bladder. Stomach/Bowel:  No obstruction. No appendicitis. Vascular/Lymphatic: No acute vascular abnormality. Calcified nodules or nodes on the retroperitoneum, incidental to the history. No mass or adenopathy. Reproductive:No pathologic findings. Other: No ascites or pneumoperitoneum. Musculoskeletal: No acute abnormalities. IMPRESSION: Stable CT.  No acute finding. Electronically Signed   By: Monte Fantasia M.D.   On: 11/29/2020 06:42   CT Abdomen Pelvis Wo Contrast  Result Date: 11/26/2020 CLINICAL DATA:  Abdominal pain, fever.  History of  pancreatitis EXAM: CT ABDOMEN AND PELVIS WITHOUT CONTRAST TECHNIQUE: Multidetector CT imaging of the abdomen and pelvis was performed following the standard protocol without IV contrast. COMPARISON:  05/14/2015 FINDINGS: Lower chest: Lung bases are clear. No effusions. Heart is normal size. Hepatobiliary: No focal hepatic abnormality. Gallbladder unremarkable. Pancreas: No focal abnormality or ductal dilatation. No peripancreatic inflammation. Spleen: No focal abnormality.  Normal size. Adrenals/Urinary Tract: No adrenal abnormality. No focal renal abnormality. No stones or hydronephrosis. Urinary bladder is unremarkable.  Stomach/Bowel: Normal appendix. Stomach, large and small bowel grossly unremarkable. Vascular/Lymphatic: No evidence of aneurysm or adenopathy. Reproductive: No visible focal abnormality. Other: No free fluid or free air. Musculoskeletal: No acute bony abnormality. IMPRESSION: No CT evidence for pancreatitis. No acute findings in the abdomen or pelvis. Electronically Signed   By: Rolm Baptise M.D.   On: 11/26/2020 09:30   DG Neck Soft Tissue  Result Date: 11/26/2020 CLINICAL DATA:  Sore throat and hoarseness EXAM: NECK SOFT TISSUES - 1+ VIEW COMPARISON:  None. FINDINGS: Frontal and lateral views were obtained. The epiglottis and aryepiglottic folds appear normal. Prevertebral soft tissues are normal. No air-fluid level to suggest abscess. Tongue and tongue base regions appear normal. Visualized trachea appears unremarkable. Visualized upper lung regions are clear. No pneumomediastinum evident in the upper chest and neck. There is elevation of the right C7 transverse process. IMPRESSION: No pneumomediastinum evident. Epiglottis and aryepiglottic folds appear normal. Other soft tissues appear unremarkable. No bony abnormality appreciable. Electronically Signed   By: Lowella Grip III M.D.   On: 11/26/2020 07:57   DG Chest 2 View  Result Date: 11/29/2020 CLINICAL DATA:  Chest pain EXAM: CHEST - 2 VIEW COMPARISON:  11/26/2020 FINDINGS: The heart size and mediastinal contours are within normal limits. Both lungs are clear. The visualized skeletal structures are unremarkable. IMPRESSION: No active cardiopulmonary disease. Electronically Signed   By: Fidela Salisbury MD   On: 11/29/2020 03:14   DG Chest 2 View  Result Date: 11/26/2020 CLINICAL DATA:  Cough and hoarseness EXAM: CHEST - 2 VIEW COMPARISON:  April 23, 2016 FINDINGS: Lungs are clear. Heart size and pulmonary vascularity are normal. No adenopathy. No pneumothorax. No bone lesions. IMPRESSION: Lungs clear.  Cardiac silhouette normal. Electronically  Signed   By: Lowella Grip III M.D.   On: 11/26/2020 07:57   US Abdomen Limited RUQ (LIVER/GB)  Result Date: 11/26/2020 CLINICAL DATA:  40 year old male with nausea and vomiting for 2 days. EXAM: ULTRASOUND ABDOMEN LIMITED RIGHT UPPER QUADRANT COMPARISON:  CT Abdomen and Pelvis 05/14/2015. FINDINGS: Gallbladder: No gallstones or wall thickening visualized. No sonographic Murphy sign noted by sonographer. Common bile duct: Diameter: 2 mm, normal. Liver: Echogenic liver (image 29). No discrete liver lesion or intrahepatic biliary ductal dilatation. Portal vein is patent on color Doppler imaging with normal direction of blood flow towards the liver. Other: Negative visible right kidney. IMPRESSION: 1. Chronic hepatic steatosis. 2. Negative gallbladder and no evidence of bile duct obstruction. Electronically Signed   By: Genevie Ann M.D.   On: 11/26/2020 07:39    EGD 12/01/2020 Dr. Fuller Plan: Impression:        - LA Grade B esophagitis with no bleeding.                           Esophagitis could be secondary to vomiting.  Biopsied.                           - Mild erosive gastropathy with no bleeding and no                           stigmata of recent bleeding. Biopsied.                           - Normal duodenal bulb and second portion of the                           duodenum. Recommendation: - Return patient to hospital ward for ongoing care.                           - Clear liquid diet today.                           - Follow antireflux measures including HOB elevated                           > 30 degrees at all times.                           - Continue present medications.                           - Discontinue Pepcid.                           - Protonix (pantoprazole) 40 mg IV q12h.                           - Increase Bentyl to 20 mg po qid (ac & hs).                           - Suggest changing Ativan to scheduled dosing for                           anxiety,  N/V - defer to primary service.                           - No aspirin, ibuprofen, naproxen, or other                           non-steroidal anti-inflammatory drugs.                           - Await pathology results.  Subjective: Dressed in street clothes early this morning, ready to go. Nausea and vomiting have improved significantly.  Discharge Exam: Vitals:   12/03/20 2114 12/04/20 0436  BP: 106/71 109/69  Pulse: 67 66  Resp: 17 16  Temp: 98.4 F (36.9 C) 98.4 F (36.9 C)  SpO2: 98% 94%   General: Pt is alert, awake, not in acute distress Cardiovascular: RRR, S1/S2 +, no rubs, no gallops Respiratory: CTA bilaterally,  no wheezing, no rhonchi Abdominal: Soft, NT, ND, bowel sounds + Extremities: No edema, no cyanosis  Labs: BNP (last 3 results) No results for input(s): BNP in the last 8760 hours. Basic Metabolic Panel: Recent Labs  Lab 11/29/20 0233 11/30/20 0152 12/01/20 0025 12/02/20 0223 12/03/20 1501 12/04/20 0413  NA 137 138 139 137 139 136  K 3.5 3.7 3.0* 2.7* 3.3* 3.9  CL 103 101 104 104 106 106  CO2 21* 25 24 24 23  21*  GLUCOSE 105* 99 74 105* 106* 106*  BUN 11 8 10 8 8 8   CREATININE 0.89 0.91 0.79 0.78 0.81 0.76  CALCIUM 8.9 8.7* 8.3* 8.2* 9.0 8.8*  MG 2.0  --  2.2  --  2.2  --    Liver Function Tests: Recent Labs  Lab 11/28/20 1036 11/29/20 0233 12/01/20 0025  AST 23 30 12*  ALT 29 29 19   ALKPHOS 64 60 51  BILITOT 1.0 2.0* 1.0  PROT 7.6 7.3 5.9*  ALBUMIN 4.4 4.3 3.4*   Recent Labs  Lab 11/28/20 1036 11/29/20 0233  LIPASE 31 32   No results for input(s): AMMONIA in the last 168 hours. CBC: Recent Labs  Lab 11/28/20 1036 11/29/20 0233 11/30/20 0152 12/01/20 0025  WBC 12.7* 16.4* 13.3* 10.4  NEUTROABS 8.6*  --   --   --   HGB 15.8 15.6 15.2 14.3  HCT 44.8 45.0 43.7 40.9  MCV 84.1 85.2 84.7 85.2  PLT 245 270 228 224   Cardiac Enzymes: No results for input(s): CKTOTAL, CKMB, CKMBINDEX, TROPONINI in the last 168  hours. BNP: Invalid input(s): POCBNP CBG: No results for input(s): GLUCAP in the last 168 hours. D-Dimer No results for input(s): DDIMER in the last 72 hours. Hgb A1c No results for input(s): HGBA1C in the last 72 hours. Lipid Profile No results for input(s): CHOL, HDL, LDLCALC, TRIG, CHOLHDL, LDLDIRECT in the last 72 hours. Thyroid function studies No results for input(s): TSH, T4TOTAL, T3FREE, THYROIDAB in the last 72 hours.  Invalid input(s): FREET3 Anemia work up No results for input(s): VITAMINB12, FOLATE, FERRITIN, TIBC, IRON, RETICCTPCT in the last 72 hours. Urinalysis    Component Value Date/Time   COLORURINE YELLOW 11/29/2020 0428   APPEARANCEUR CLEAR 11/29/2020 0428   LABSPEC 1.024 11/29/2020 0428   PHURINE 5.0 11/29/2020 0428   GLUCOSEU NEGATIVE 11/29/2020 0428   HGBUR SMALL (A) 11/29/2020 0428   BILIRUBINUR NEGATIVE 11/29/2020 0428   KETONESUR 80 (A) 11/29/2020 0428   PROTEINUR 30 (A) 11/29/2020 0428   NITRITE NEGATIVE 11/29/2020 0428   LEUKOCYTESUR NEGATIVE 11/29/2020 0428    Microbiology Recent Results (from the past 240 hour(s))  Resp Panel by RT-PCR (Flu A&B, Covid) Nasopharyngeal Swab     Status: None   Collection Time: 11/26/20  8:14 AM   Specimen: Nasopharyngeal Swab; Nasopharyngeal(NP) swabs in vial transport medium  Result Value Ref Range Status   SARS Coronavirus 2 by RT PCR NEGATIVE NEGATIVE Final    Comment: (NOTE) SARS-CoV-2 target nucleic acids are NOT DETECTED.  The SARS-CoV-2 RNA is generally detectable in upper respiratory specimens during the acute phase of infection. The lowest concentration of SARS-CoV-2 viral copies this assay can detect is 138 copies/mL. A negative result does not preclude SARS-Cov-2 infection and should not be used as the sole basis for treatment or other patient management decisions. A negative result may occur with  improper specimen collection/handling, submission of specimen other than nasopharyngeal swab,  presence of viral mutation(s) within the areas targeted by this  assay, and inadequate number of viral copies(<138 copies/mL). A negative result must be combined with clinical observations, patient history, and epidemiological information. The expected result is Negative.  Fact Sheet for Patients:  EntrepreneurPulse.com.au  Fact Sheet for Healthcare Providers:  IncredibleEmployment.be  This test is no t yet approved or cleared by the [place] FDA and  has been authorized for detection and/or diagnosis of SARS-CoV-2 by FDA under an Emergency Use Authorization (EUA). This EUA will remain  in effect (meaning this test can be used) for the duration of the COVID-19 declaration under Section 564(b)(1) of the Act, 21 U.S.C.section 360bbb-3(b)(1), unless the authorization is terminated  or revoked sooner.       Influenza A by PCR NEGATIVE NEGATIVE Final   Influenza B by PCR NEGATIVE NEGATIVE Final    Comment: (NOTE) The Xpert Xpress SARS-CoV-2/FLU/RSV plus assay is intended as an aid in the diagnosis of influenza from Nasopharyngeal swab specimens and should not be used as a sole basis for treatment. Nasal washings and aspirates are unacceptable for Xpert Xpress SARS-CoV-2/FLU/RSV testing.  Fact Sheet for Patients: EntrepreneurPulse.com.au  Fact Sheet for Healthcare Providers: IncredibleEmployment.be  This test is not yet approved or cleared by the [place] FDA and has been authorized for detection and/or diagnosis of SARS-CoV-2 by FDA under an Emergency Use Authorization (EUA). This EUA will remain in effect (meaning this test can be used) for the duration of the COVID-19 declaration under Section 564(b)(1) of the Act, 21 U.S.C. section 360bbb-3(b)(1), unless the authorization is terminated or revoked.  Performed at [facility], [place] [address] [place], [place] [address]   Resp  Panel by RT-PCR (Flu A&B, Covid) Nasopharyngeal Swab     Status: None   Collection Time: 11/29/20 11:35 AM   Specimen: Nasopharyngeal Swab; Nasopharyngeal(NP) swabs in vial transport medium  Result Value Ref Range Status   SARS Coronavirus 2 by RT PCR NEGATIVE NEGATIVE Final    Comment: (NOTE) SARS-CoV-2 target nucleic acids are NOT DETECTED.  The SARS-CoV-2 RNA is generally detectable in upper respiratory specimens during the acute phase of infection. The lowest concentration of SARS-CoV-2 viral copies this assay can detect is 138 copies/mL. A negative result does not preclude SARS-Cov-2 infection and should not be used as the sole basis for treatment or other patient management decisions. A negative result may occur with  improper specimen collection/handling, submission of specimen other than nasopharyngeal swab, presence of viral mutation(s) within the areas targeted by this assay, and inadequate number of viral copies(<138 copies/mL). A negative result must be combined with clinical observations, patient history, and epidemiological information. The expected result is Negative.  Fact Sheet for Patients:  EntrepreneurPulse.com.au  Fact Sheet for Healthcare Providers:  IncredibleEmployment.be  This test is no t yet approved or cleared by the [place] FDA and  has been authorized for detection and/or diagnosis of SARS-CoV-2 by FDA under an Emergency Use Authorization (EUA). This EUA will remain  in effect (meaning this test can be used) for the duration of the COVID-19 declaration under Section 564(b)(1) of the Act, 21 U.S.C.section 360bbb-3(b)(1), unless the authorization is terminated  or revoked sooner.       Influenza A by PCR NEGATIVE NEGATIVE Final   Influenza B by PCR NEGATIVE NEGATIVE Final    Comment: (NOTE) The Xpert Xpress SARS-CoV-2/FLU/RSV plus assay is intended as an aid in the diagnosis of influenza from Nasopharyngeal  swab specimens and should not be used as a sole basis for treatment. Nasal washings  and aspirates are unacceptable for Xpert Xpress SARS-CoV-2/FLU/RSV testing.  Fact Sheet for Patients: EntrepreneurPulse.com.au  Fact Sheet for Healthcare Providers: IncredibleEmployment.be  This test is not yet approved or cleared by the [place] FDA and has been authorized for detection and/or diagnosis of SARS-CoV-2 by FDA under an Emergency Use Authorization (EUA). This EUA will remain in effect (meaning this test can be used) for the duration of the COVID-19 declaration under Section 564(b)(1) of the Act, 21 U.S.C. section 360bbb-3(b)(1), unless the authorization is terminated or revoked.  Performed at [facility], [place] [address], [place], [place] [address]     Time coordinating discharge: Approximately 40 minutes  Patrecia Pour, MD  [facility] 12/04/2020, 7:27 AM

## 2020-12-04 NOTE — Progress Notes (Signed)
Discharge Home. Home discharge instruction given, no question verbalized. Patient is alert and oriented, not in any distress, denies any pain upon discharge.

## 2020-12-04 NOTE — Plan of Care (Signed)

## 2021-02-18 ENCOUNTER — Telehealth (HOSPITAL_BASED_OUTPATIENT_CLINIC_OR_DEPARTMENT_OTHER): Payer: Self-pay | Admitting: Otology & Neurotology

## 2021-02-18 ENCOUNTER — Other Ambulatory Visit (HOSPITAL_BASED_OUTPATIENT_CLINIC_OR_DEPARTMENT_OTHER): Payer: Self-pay | Admitting: Otology & Neurotology

## 2021-02-18 DIAGNOSIS — H6522 Chronic serous otitis media, left ear: Secondary | ICD-10-CM

## 2021-02-18 DIAGNOSIS — H6983 Other specified disorders of Eustachian tube, bilateral: Secondary | ICD-10-CM

## 2021-02-18 DIAGNOSIS — H906 Mixed conductive and sensorineural hearing loss, bilateral: Secondary | ICD-10-CM

## 2021-02-18 DIAGNOSIS — H7191 Unspecified cholesteatoma, right ear: Secondary | ICD-10-CM

## 2021-02-18 DIAGNOSIS — H6981 Other specified disorders of Eustachian tube, right ear: Secondary | ICD-10-CM

## 2021-02-18 MED ORDER — OFLOXACIN 0.3 % OT SOLN
5.0000 [drp] | Freq: Two times a day (BID) | OTIC | 2 refills | Status: AC
Start: 2021-02-18 — End: ?

## 2021-02-18 NOTE — Telephone Encounter (Signed)
Pt is establish with Dr. Berkley Harvey.  Last appt 2020.    I scheduled him for an RHT in February - soonest available.      Pt is experiencing what he feels in an ear infection x 1wk.  He has pressure, smelly drainage.  Pls follow up with pt

## 2021-02-26 ENCOUNTER — Telehealth (HOSPITAL_BASED_OUTPATIENT_CLINIC_OR_DEPARTMENT_OTHER): Payer: Self-pay | Admitting: Otology & Neurotology

## 2021-02-26 NOTE — Telephone Encounter (Signed)
He could see Dr Salena Saner:    [email or web address]

## 2021-02-26 NOTE — Telephone Encounter (Signed)
RETURN CALL: Voicemail - Detailed Message      SUBJECT:  Medication Questions     NAME OF MEDICATION(S):   ofloxacin 0.3 % otic solution   CONCERNS/QUESTIONS: Patient has been on this medication for 5 days with no symptom improvement.  ADDITIONAL INFORMATION:   Patient stated with previous infections he would have ear fluid vacuumed out along side antibiotic ear drops.    Please call patient to discuss his options, or if there is anyone he can see closer to his home    Attempted warm transfer but clinic was busy with patients.

## 2021-02-26 NOTE — Telephone Encounter (Signed)
msg sent to [facility]e to triage appt or ref pt to ENT clinic closer to where he lives

## 2021-02-27 ENCOUNTER — Telehealth (HOSPITAL_BASED_OUTPATIENT_CLINIC_OR_DEPARTMENT_OTHER): Payer: Self-pay | Admitting: Otology & Neurotology

## 2021-02-27 NOTE — Telephone Encounter (Signed)
[facility] msg sent to PT    Good morning Gregary Signs,    Dr Berkley Harvey recommends Dr Salena Saner (ENT) in [facility]    [email or web address]    Thanks  Wallace Cullens RN

## 2021-02-27 NOTE — Telephone Encounter (Signed)
Warren Dixon spoke with RN Mindi Junker today. He was referral to Dr. Salena Saner.     Dr. Salena Saner will only take patient with referral only.

## 2021-02-28 ENCOUNTER — Other Ambulatory Visit (HOSPITAL_BASED_OUTPATIENT_CLINIC_OR_DEPARTMENT_OTHER): Payer: Self-pay | Admitting: Otology & Neurotology

## 2021-02-28 DIAGNOSIS — H6983 Other specified disorders of Eustachian tube, bilateral: Secondary | ICD-10-CM

## 2021-02-28 DIAGNOSIS — H7191 Unspecified cholesteatoma, right ear: Secondary | ICD-10-CM

## 2021-02-28 NOTE — Telephone Encounter (Signed)
[facility] msg sent to Pt    Hi Warren Dixon,    Dr Berkley Harvey placed a referral to go see Dr Salena Saner. His staff should get in touch with you soon. If you don't hear from them soon then give them a call.    Thanks  [employer]

## 2021-03-11 ENCOUNTER — Telehealth: Payer: Self-pay

## 2021-03-11 NOTE — Telephone Encounter (Signed)
Pt called to re-establish care. Has an appt for 12/9. Would like to get medication refills for his heart medication or a sooner appointment to continue getting his medication

## 2021-03-14 NOTE — Telephone Encounter (Signed)
Patient has been attending [facility], he said he will look at linking his mychart account and believes his medications should be visible on epic. Mailed a release form for him to sign

## 2021-03-14 NOTE — Telephone Encounter (Signed)
Have not seen him since 2017.  Need his list of meds.  Have no idea what he has been taking or where filled.  Please ask where he was obtaining care since then and have sign release of info

## 2021-03-15 ENCOUNTER — Encounter: Payer: Self-pay | Admitting: Internal Medicine

## 2021-03-18 NOTE — Telephone Encounter (Signed)
Does not look like he has been seen by primary care at [facility] for a while as well, so needs to be seen here first before will fill.  Can put him on wait list

## 2021-03-19 NOTE — Telephone Encounter (Signed)
Pt will be added to the wait list. Pt will call to give list of current medications

## 2021-03-19 NOTE — Telephone Encounter (Signed)
Voicemail left to call back 

## 2021-03-21 NOTE — Telephone Encounter (Signed)
[place] is currently taking: Sertraline 100 mg daily Lorazepam 2mg -.5mg  as needed Diltiazem 120 mg daily

## 2021-03-28 ENCOUNTER — Encounter (HOSPITAL_BASED_OUTPATIENT_CLINIC_OR_DEPARTMENT_OTHER): Payer: No Typology Code available for payment source | Admitting: Audiologist

## 2021-04-19 ENCOUNTER — Other Ambulatory Visit: Payer: Self-pay | Admitting: Internal Medicine

## 2021-04-23 ENCOUNTER — Encounter: Payer: Self-pay | Admitting: Internal Medicine

## 2021-04-24 ENCOUNTER — Other Ambulatory Visit: Payer: Self-pay | Admitting: Internal Medicine

## 2021-04-25 ENCOUNTER — Telehealth: Payer: PRIVATE HEALTH INSURANCE | Admitting: Family Medicine

## 2021-04-25 DIAGNOSIS — R112 Nausea with vomiting, unspecified: Secondary | ICD-10-CM

## 2021-04-25 NOTE — Progress Notes (Signed)
[place]  Pt has Hx of afib reports not having Cardizem medication and now feeling poorly. He was recommended to UC for evaluation.

## 2021-04-26 ENCOUNTER — Other Ambulatory Visit: Payer: Self-pay

## 2021-04-26 ENCOUNTER — Ambulatory Visit
Admission: RE | Admit: 2021-04-26 | Discharge: 2021-04-26 | Disposition: A | Discharge status: Home / Self Care | Payer: PRIVATE HEALTH INSURANCE | Source: Ambulatory Visit

## 2021-04-26 NOTE — ED Triage Notes (Signed)
Pt states that he is needing medication refills.

## 2021-04-26 NOTE — ED Provider Notes (Addendum)
Patient presents today requesting refills of sertraline and diltiazem.  EMR reviewed.  Patient was seen via E-visit yesterday, complained of nausea and vomiting as well as migraine, per E-visit note, patient was advised to go to urgent care.  Patient presents to urgent care today requesting refills of diltiazem and sertraline.  Per EMR, patient's prescription for sertraline 100 and prescription for lorazepam were sent for refill to the [facility] health pharmacy by Dr. Mack Hook on April 25, 2019.  Diltiazem has not been filled for this patient as an outpatient since 2017.  Patient was hospitalized June 2022 for intractable vomiting with nausea.  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  Patient also has a history of anxiety disorder, depression, Tourette's, marijuana abuse, tobacco dependence.  On arrival today, patient's blood pressure is stable, heart rate is regular.  Patient mentions multiple attempts to reach out to his provider by the  and [facility], however he does not mention having any nausea, vomiting or migraine at this time.  Patient appears well.  I have advised patient to pick up his prescription for sertraline at the [place] and that he will need to be evaluated for renewal of prescription for diltiazem as today I do not feel that this particular medication is indicated nor will I be monitoring him during his use.  Patient verbalized understanding.  No services were provided today.   Lynden Oxford Scales, PA-C 04/26/21 1147    Lynden Oxford Scales, PA-C 04/26/21 1201

## 2021-04-27 ENCOUNTER — Ambulatory Visit: Payer: PRIVATE HEALTH INSURANCE

## 2021-04-27 ENCOUNTER — Other Ambulatory Visit (HOSPITAL_COMMUNITY): Payer: Self-pay

## 2021-04-29 ENCOUNTER — Other Ambulatory Visit: Payer: Self-pay | Admitting: Internal Medicine

## 2021-04-29 ENCOUNTER — Other Ambulatory Visit (HOSPITAL_BASED_OUTPATIENT_CLINIC_OR_DEPARTMENT_OTHER): Payer: Self-pay

## 2021-05-31 ENCOUNTER — Ambulatory Visit: Payer: Self-pay | Admitting: Internal Medicine

## 2021-05-31 ENCOUNTER — Other Ambulatory Visit: Payer: Self-pay

## 2021-05-31 ENCOUNTER — Encounter: Payer: Self-pay | Admitting: Internal Medicine

## 2021-05-31 VITALS — BP 142/98 | HR 88 | Resp 12 | Ht 69.5 in | Wt 249.0 lb

## 2021-05-31 DIAGNOSIS — F410 Panic disorder [episodic paroxysmal anxiety] without agoraphobia: Secondary | ICD-10-CM

## 2021-05-31 DIAGNOSIS — F1920 Other psychoactive substance dependence, uncomplicated: Secondary | ICD-10-CM

## 2021-05-31 DIAGNOSIS — I480 Paroxysmal atrial fibrillation: Secondary | ICD-10-CM

## 2021-05-31 DIAGNOSIS — F32A Depression, unspecified: Secondary | ICD-10-CM

## 2021-05-31 DIAGNOSIS — F4001 Agoraphobia with panic disorder: Secondary | ICD-10-CM

## 2021-05-31 MED ORDER — SERTRALINE HCL 100 MG PO TABS: ORAL_TABLET | ORAL | 11 refills | Status: DC

## 2021-05-31 MED ORDER — ALPRAZOLAM 1 MG PO TABS: ORAL_TABLET | ORAL | 0 refills | Status: DC

## 2021-05-31 MED ORDER — DILTIAZEM HCL ER COATED BEADS 120 MG PO CP24: 120.0000 mg | ORAL_CAPSULE | Freq: Every day | ORAL | 11 refills | Status: DC

## 2021-05-31 NOTE — Patient Instructions (Signed)
Alprazolam 1 mg tabs:  05/31/21 through 06/13/21:  Take Alprazolam 1 mg at bedtime  06/14/21 through 06/27/21:  Take Alprazolam 1/2 mg at bedtime  06/28/21 through 07/11/21:  Take Alprazolam 1/4 mg at bedtime  07/12/21 through 07/25/21:  Take Alprazolam 1/4 mg at bedtime every other evening.  07/26/21:  done with Alprazolam

## 2021-05-31 NOTE — Progress Notes (Unsigned)
    Subjective:    Patient ID: Alejandro Flores, male   DOB: [birth year], 40 y.o.   MRN: [id number]   HPI   Alprazolam:  is getting from a family friend.  States went to [facility] ED as he was having restlessness, nausea, dyspnea, shakiness, muscle spasms and panic attacks.  He did try to taper with his internist at [facility] this past spring, but had already decreased his Sertraline from 150 mg to 100 mg as he felt he needed to taper that prior and perhaps come off maybe 4 months prior.  As he decreased Alprazolam, he started having more panic attacks.  History of alcohol abuse--no alcohol for about 6 years now, but history of addiction.    2.  Panic Disorder:  Discussed Sertraline may need to continue to be a lifelong treatment.  Has possibly been on another SSRI in past--was on Bupropion for depression in past, but does not remember any other medication.    3.  History of afib, most associated with alcohol abuse, though states has had episode since stopped alcohol.  which he has been diagnosed with afib (converted without treatment, though may have lasted 2 hours)  last occurring 3 years ago at [facility].  He intermittently has palpitations which he feels is afib, though not clear he is checking pulse/rhythm with these episodes and are short lived.  Has been on and off meds since June, has been sporadic with all three meds.  Has had to obtain from friends or family to maintain.   Has not had Diltiazem for about 72 hours.    Current Meds  Medication Sig   alprazolam (XANAX) 2 MG tablet Take 2 mg by mouth as needed for sleep. Takes 0.5mg  as needed for anxiety   diltiazem (CARDIZEM CD) 120 MG 24 hr capsule 1 tab once daily by mouth   sertraline (ZOLOFT) 100 MG tablet Take 100 mg by mouth daily.   Allergies  Allergen Reactions   Morphine Itching and Rash    "hives"     Review of Systems    Objective:   BP (!) 142/98 (BP Location: Right Arm, Patient Position: Sitting, Cuff Size: Normal)    Pulse 88   Resp 12   Ht 5' 9.5" (1.765 m)   Wt 249 lb (112.9 kg)   BMI 36.24 kg/m   Physical Exam   Assessment & Plan   ***

## 2021-06-10 NOTE — Telephone Encounter (Signed)
Seen by dr Amil Amen

## 2021-07-03 ENCOUNTER — Other Ambulatory Visit: Payer: Self-pay | Admitting: Internal Medicine

## 2021-07-10 ENCOUNTER — Ambulatory Visit: Payer: Self-pay | Admitting: Internal Medicine

## 2021-07-23 ENCOUNTER — Ambulatory Visit: Payer: Self-pay | Admitting: Internal Medicine

## 2021-08-08 ENCOUNTER — Ambulatory Visit (HOSPITAL_BASED_OUTPATIENT_CLINIC_OR_DEPARTMENT_OTHER): Payer: No Typology Code available for payment source | Admitting: Otology & Neurotology

## 2021-08-21 IMAGING — CT CT ABD-PELV W/O CM
2 of 4 series · 16 of 46 positions shown, 18 images · non-contrast
Comparison: 05/14/2015

CLINICAL DATA: Abdominal pain, fever.  History of pancreatitis

EXAM:
CT ABDOMEN AND PELVIS WITHOUT CONTRAST
TECHNIQUE: Multidetector CT imaging of the abdomen and pelvis was performed
following the standard protocol without IV contrast.

[Series 2: axial st · axial · 0.96mm/px · z∈[-681,-186]mm · 13 of 113 slices shown, 15 images]
[im 7/113  soft-tissue]
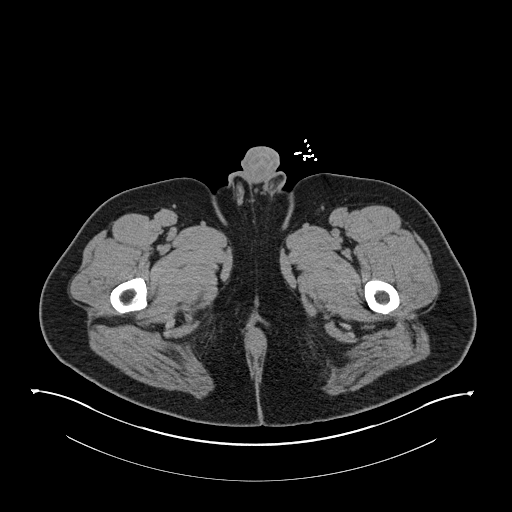
[im 7/113  bone]
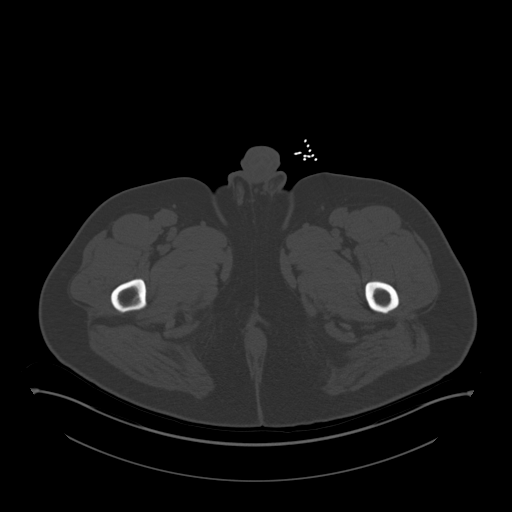
[im 14/113  soft-tissue]
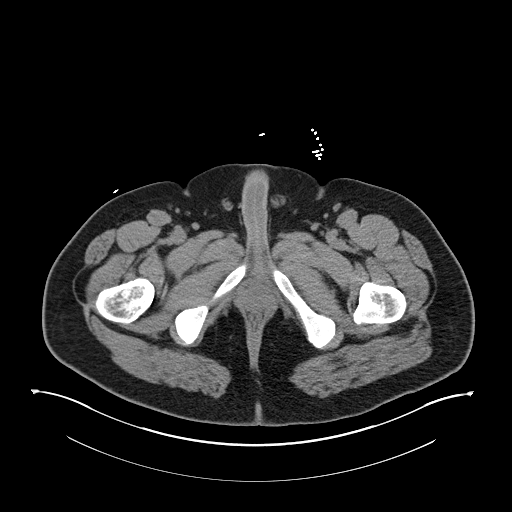
[im 27/113  soft-tissue]
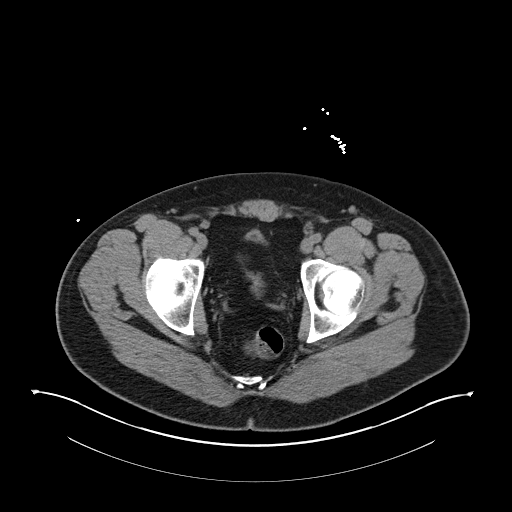
[im 33/113  soft-tissue]
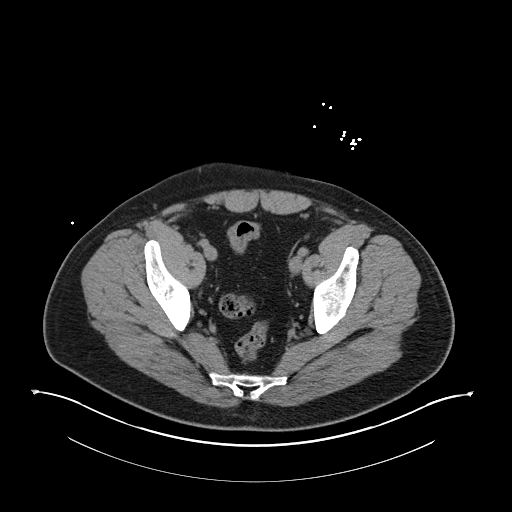
[im 40/113  soft-tissue]
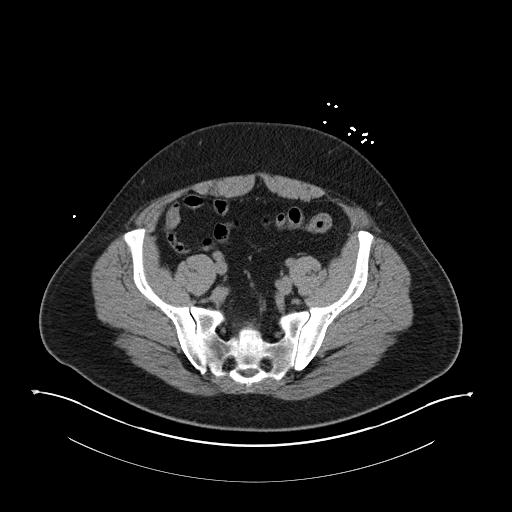
[im 47/113  soft-tissue]
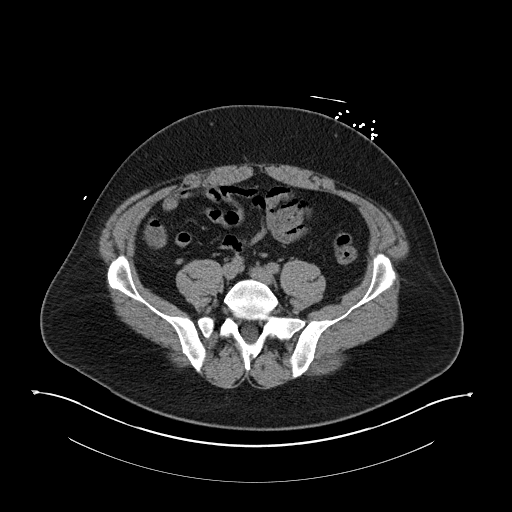
[im 60/113  soft-tissue]
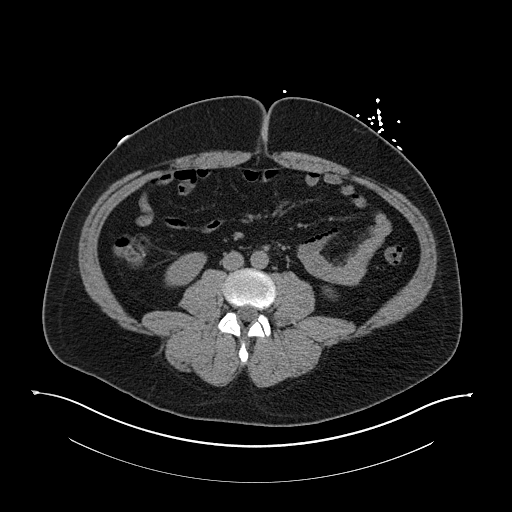
[im 66/113  soft-tissue]
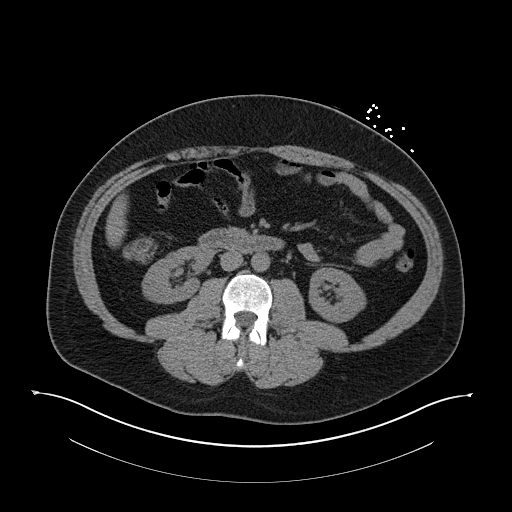
[im 73/113  soft-tissue]
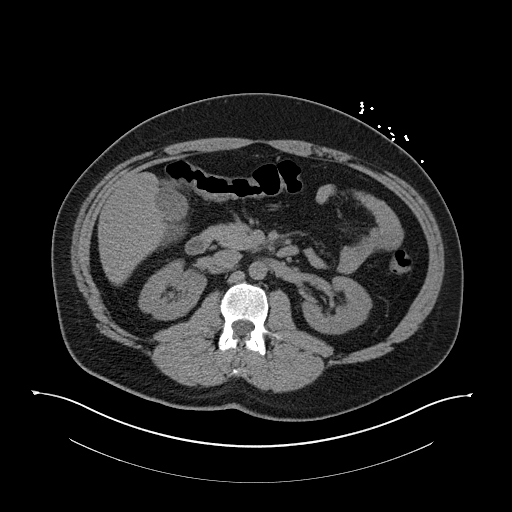
[im 73/113  bone]
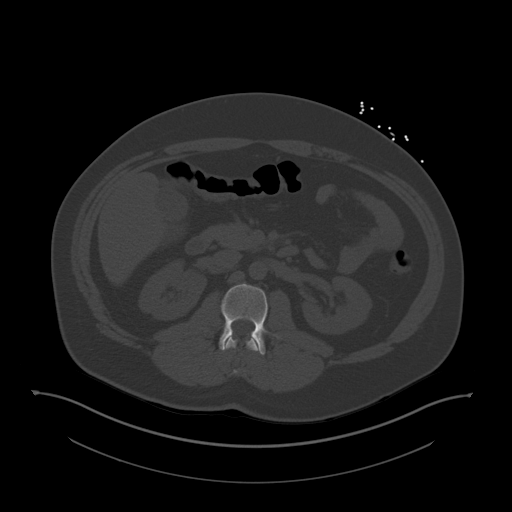
[im 80/113  soft-tissue]
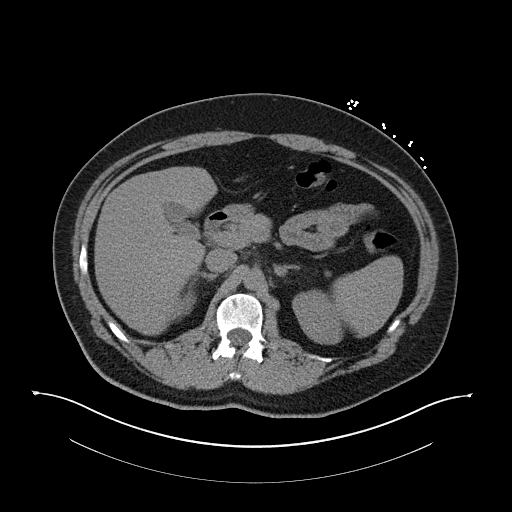
[im 86/113  soft-tissue]
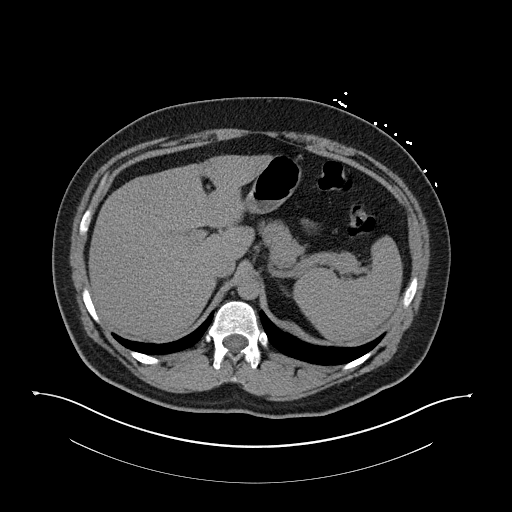
[im 99/113  soft-tissue]
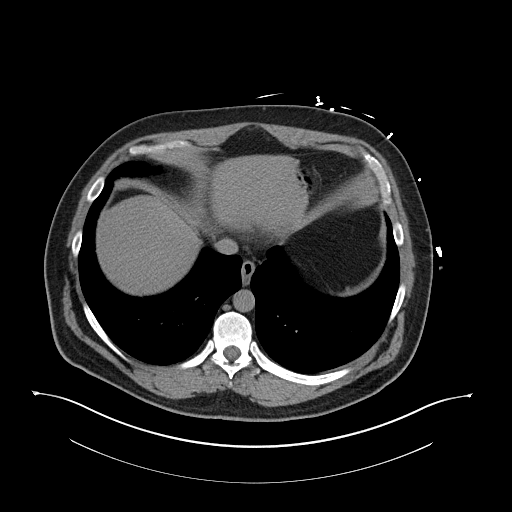
[im 106/113  soft-tissue]
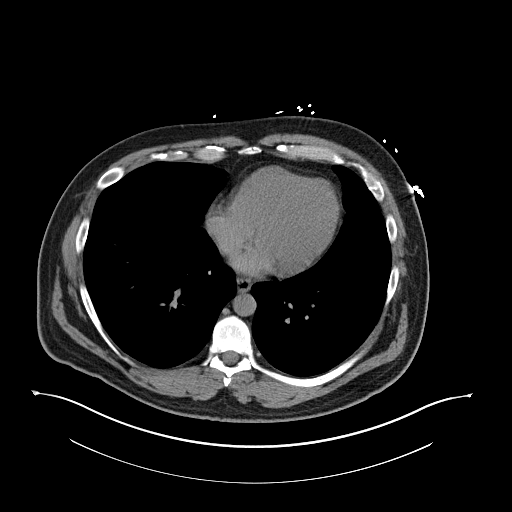

[Series 4: coronal st · coronal · 0.81mm/px · 3 of 170 slices shown]
[im 57/170  soft-tissue]
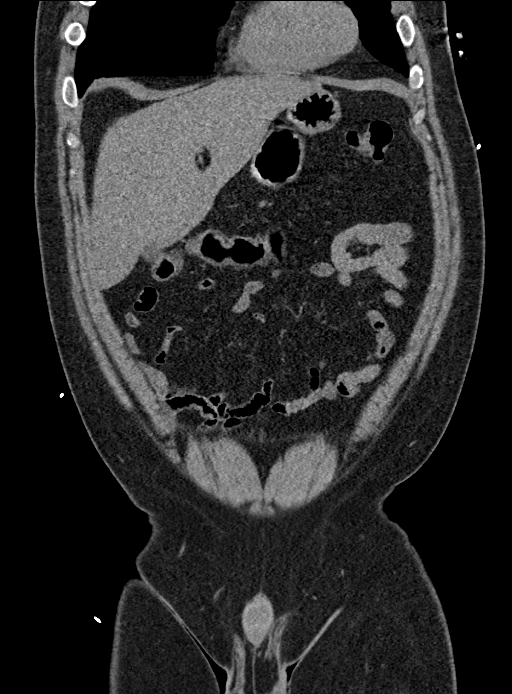
[im 76/170  soft-tissue]
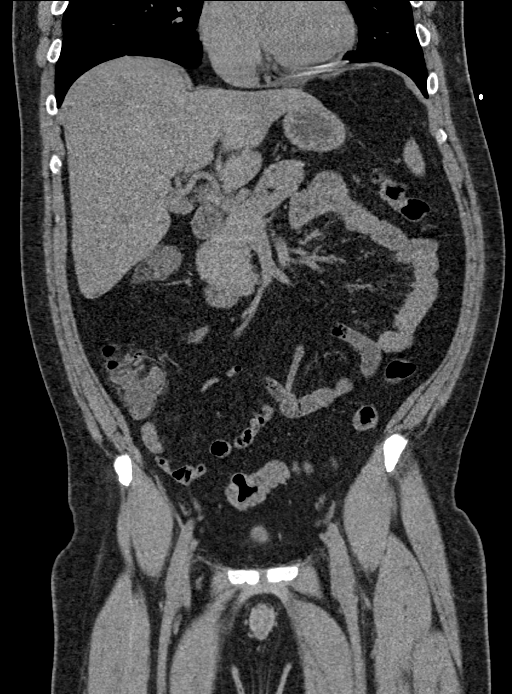
[im 94/170  soft-tissue]
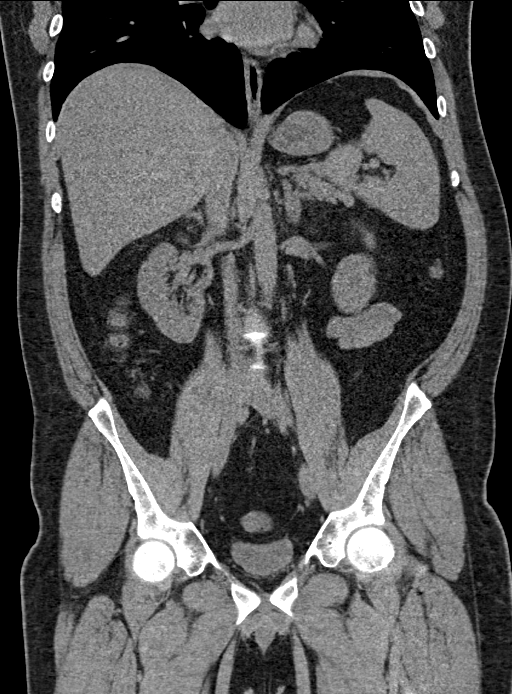

[16 of 46 positions shown; findings below may reference images not displayed]

FINDINGS: Lower chest: Lung bases are clear. No effusions. Heart is normal
size.

Hepatobiliary: No focal hepatic abnormality. Gallbladder
unremarkable.

Pancreas: No focal abnormality or ductal dilatation. No
peripancreatic inflammation.

Spleen: No focal abnormality.  Normal size.

Adrenals/Urinary Tract: No adrenal abnormality. No focal renal
abnormality. No stones or hydronephrosis. Urinary bladder is
unremarkable.

Stomach/Bowel: Normal appendix. Stomach, large and small bowel
grossly unremarkable.

Vascular/Lymphatic: No evidence of aneurysm or adenopathy.

Reproductive: No visible focal abnormality.

Other: No free fluid or free air.

Musculoskeletal: No acute bony abnormality.
IMPRESSION: No CT evidence for pancreatitis.

No acute findings in the abdomen or pelvis.

## 2022-05-29 ENCOUNTER — Other Ambulatory Visit: Payer: Self-pay | Admitting: Internal Medicine

## 2022-07-02 ENCOUNTER — Other Ambulatory Visit: Payer: Self-pay | Admitting: Internal Medicine

## 2022-07-14 ENCOUNTER — Other Ambulatory Visit: Payer: Self-pay | Admitting: Internal Medicine

## 2022-07-16 DIAGNOSIS — I471 Supraventricular tachycardia, unspecified: Secondary | ICD-10-CM | POA: Insufficient documentation

## 2022-07-18 ENCOUNTER — Ambulatory Visit (INDEPENDENT_AMBULATORY_CARE_PROVIDER_SITE_OTHER): Payer: Self-pay | Admitting: Family Medicine

## 2022-07-18 ENCOUNTER — Encounter: Payer: Self-pay | Admitting: Family Medicine

## 2022-07-18 VITALS — BP 136/84 | HR 95 | Temp 97.1°F | Ht 70.0 in | Wt 261.4 lb

## 2022-07-18 DIAGNOSIS — M1A9XX0 Chronic gout, unspecified, without tophus (tophi): Secondary | ICD-10-CM

## 2022-07-18 DIAGNOSIS — I471 Supraventricular tachycardia, unspecified: Secondary | ICD-10-CM

## 2022-07-18 DIAGNOSIS — F418 Other specified anxiety disorders: Secondary | ICD-10-CM

## 2022-07-18 DIAGNOSIS — F17290 Nicotine dependence, other tobacco product, uncomplicated: Secondary | ICD-10-CM

## 2022-07-18 DIAGNOSIS — F1021 Alcohol dependence, in remission: Secondary | ICD-10-CM

## 2022-07-18 DIAGNOSIS — I10 Essential (primary) hypertension: Secondary | ICD-10-CM

## 2022-07-18 DIAGNOSIS — E782 Mixed hyperlipidemia: Secondary | ICD-10-CM

## 2022-07-18 DIAGNOSIS — Z8719 Personal history of other diseases of the digestive system: Secondary | ICD-10-CM | POA: Insufficient documentation

## 2022-07-18 LAB — COMPREHENSIVE METABOLIC PANEL
ALT: 21 U/L (ref 0–53)
AST: 16 U/L (ref 0–37)
Albumin: 4.8 g/dL (ref 3.5–5.2)
Alkaline Phosphatase: 70 U/L (ref 39–117)
BUN: 11 mg/dL (ref 6–23)
CO2: 22 mEq/L (ref 19–32)
Calcium: 9.3 mg/dL (ref 8.4–10.5)
Chloride: 105 mEq/L (ref 96–112)
Creatinine, Ser: 0.77 mg/dL (ref 0.40–1.50)
GFR: 110.13 mL/min (ref >60.00)
Glucose, Bld: 96 mg/dL (ref 70–99)
Potassium: 4.1 mEq/L (ref 3.5–5.1)
Sodium: 138 mEq/L (ref 135–145)
Total Bilirubin: 0.4 mg/dL (ref 0.2–1.2)
Total Protein: 7.6 g/dL (ref 6.0–8.3)

## 2022-07-18 LAB — URIC ACID: Uric Acid, Serum: 9.7 mg/dL — ABNORMAL HIGH (ref 4.0–7.8)

## 2022-07-18 LAB — LIPID PANEL
Cholesterol: 280 mg/dL — ABNORMAL HIGH (ref 0–200)
HDL: 27.4 mg/dL — ABNORMAL LOW (ref >39.00)
Total CHOL/HDL Ratio: 10
Triglycerides: 775 mg/dL — ABNORMAL HIGH (ref 0.0–149.0)

## 2022-07-18 LAB — LDL CHOLESTEROL, DIRECT: Direct LDL: 163 mg/dL

## 2022-07-18 LAB — MAGNESIUM: Magnesium: 2 mg/dL (ref 1.5–2.5)

## 2022-07-18 MED ORDER — SERTRALINE HCL 50 MG PO TABS: ORAL_TABLET | ORAL | 3 refills | Status: DC

## 2022-07-18 MED ORDER — DILTIAZEM HCL ER COATED BEADS 120 MG PO CP24: 120.0000 mg | ORAL_CAPSULE | Freq: Every day | ORAL | 3 refills | Status: DC

## 2022-07-18 NOTE — Progress Notes (Signed)
[facility] [address] [place] [address] Dept: [phone] Dept Fax: [phone]  New Patient Office Visit  Subjective:    Patient ID: [name], male    DOB: March 08, 1981, 42 y.o..   MRN: [id number]  Chief Complaint  Patient presents with   Establish Care    NP-est care, Rx refills    History of Present Illness:  Patient is in today to establish care. Mr. Kinne was born in [place], [place]. 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 He then attended the [employer] in [place] arts. He  had been working as a Biomedical scientist at [employer]. However, his mother has developed non-small cell cancer. She has needed more assistance in her care. He is now working for a food delivery service. He has a new girlfriend. He has no children. Mr. Markovic notes he is a former smoker (quit about 2020). He continues to vape nicotine daily. He had a past issue with regular alcohol use. He notes he developed pancreatitis, which led to him stopping alcohol use in 2017. He also had a history of regular marijuana use. He developed cyclic nausea and vomiting, so quit marijuana in 2020.  Mr. Oetken has a history of hypertension and an SVT. He is currently manage don diltiazem 120 mg daily.  Mr. Heart has a history of a tic disorder. His chart indicates Tourette's.However, he notes issues more with a mix of mild motor (blinking) and vocal (through clearing/grunting) tics. These have not been as severe as in the past.  Mr. Gassert has a history of generalized anxiety and depression. He was treated with sertraline previously, but had tapered down and stopped this. He has also been treated with BZDs, including alprazolam and clonazepam in the past, which he had also stopped. More recently, he has noted issues with increased symptoms of anxiety and depression. He feels the stressors as a caregiver for  his mother may be at play in this.  Past Medical History: Patient Active Problem List   Diagnosis Date Noted   History of pancreatitis 07/18/2022   SVT (supraventricular tachycardia) 07/16/2022   Prolonged QT interval 11/29/2020   Class 2 obesity due to excess calories with body mass index (BMI) of 37.0 to 37.9 in adult 11/29/2020   Marijuana abuse in remission 11/29/2020   Nicotine dependence 11/29/2020   Motor tic disorder 03/13/2017   Tourette syndrome 01/03/2016   Essential hypertension 01/03/2016   Hyperlipidemia 01/03/2016   Alcoholism in remission ([place]) 08/06/2015   Paroxysmal atrial fibrillation ([place]) 08/06/2015   Anxiety with depression 08/06/2015   Panic disorder 08/06/2015   Gout 08/06/2015   Kidney stones 08/06/2015   Hypomagnesemia 01/26/2013   Hypokalemia 01/26/2013   Past Surgical History:  Procedure Laterality Date   BIOPSY  12/01/2020   Procedure: BIOPSY;  Surgeon: Ladene Artist, MD;  Location: [facility] ENDOSCOPY;  Service: Endoscopy;;   colonoscopy with polypectomy  2016   [facility]--had rectal bleeding and one was precancerous   ESOPHAGOGASTRODUODENOSCOPY (EGD) WITH PROPOFOL N/A 12/01/2020   Procedure: ESOPHAGOGASTRODUODENOSCOPY (EGD) WITH PROPOFOL;  Surgeon: Ladene Artist, MD;  Location: [place];  Service: Endoscopy;  Laterality: N/A;   Family History  Problem Relation Age of Onset   Heart disease Mother    Cancer Mother        Non-small cell lung cancer   Breast cancer Mother 20   Hyperlipidemia Mother    Anxiety  disorder Mother    Stroke Father    Alcohol abuse Father    Heart disease Father 55       Triple bypass    COPD Father        chronic smoker   Alcohol abuse Brother    Depression Brother    Seizures Brother    Cancer Maternal Aunt        Breast   Cancer Maternal Grandmother        blood cancer   Diabetes Paternal Grandmother    Stroke Paternal Grandmother    Outpatient Medications Prior to Visit  Medication Sig  Dispense Refill   ALPRAZolam (XANAX) 1 MG tablet 1 tab by mouth at bedtime daily for 14 days then taper as described in discharge documents 27 tablet 0   sertraline (ZOLOFT) 100 MG tablet 1 1/2 tabs by mouth daily 45 tablet 11   diltiazem (CARDIZEM CD) 120 MG 24 hr capsule TAKE 1 CAPSULE BY MOUTH EVERY DAY 30 capsule 0   No facility-administered medications prior to visit.   Allergies  Allergen Reactions   Morphine Itching and Rash    "hives"    Objective:   Today's Vitals   07/18/22 0856  BP: 136/84  Pulse: 95  Temp: (!) 97.1 F (36.2 C)  TempSrc: Tympanic  SpO2: 98%  Weight: 261 lb 6.4 oz (118.6 kg)  Height: '5\' 10"'$  (1.778 m)   Body mass index is 37.51 kg/m.   General: Well developed, well nourished. No acute distress. Psych: Alert and oriented. Normal mood and affect.  Health Maintenance Due  Topic Date Due   Hepatitis C Screening  Never done   INFLUENZA VACCINE  01/21/2022   COVID-19 Vaccine (2 - 2023-24 season) 02/21/2022     Assessment & Plan:   Problem List Items Addressed This Visit       Cardiovascular and Mediastinum   SVT (supraventricular tachycardia)    Rate is normal today. I will restart his diltiazem      Relevant Medications   diltiazem (CARDIZEM CD) 120 MG 24 hr capsule   Essential hypertension - Primary    Blood pressure is mildly elevated today. We will restart his diltiazem. I will check his renal function and electrolytes.      Relevant Medications   diltiazem (CARDIZEM CD) 120 MG 24 hr capsule   Other Relevant Orders   Comprehensive metabolic panel     Other   Alcoholism in remission [facility])    Mr. Hommes characterizes this as drinking to excess. However, review of his prior records indicates a past issue with alcohol abuse/addiction. I support his efforts at long-term sobriety. I do think we should be very cautious about considering use of a BZD,a s he would be at risk for developing addiction to this.      Relevant Orders    Comprehensive metabolic panel   Magnesium   Anxiety with depression    I feel it would be appropriate to restart Mr. Beveridge on sertraline. We will taper up slowly and I will see him back in 6 weeks to reassess.      Relevant Medications   sertraline (ZOLOFT) 50 MG tablet   Gout    I will check a uric acid level today to determine if he would benefit from a uricosuric.      Relevant Orders   Uric acid   Nicotine dependence    Regular vaping. He should try to stop this in light of his hypertension.  Hyperlipidemia    I will check fasting lipids today.      Relevant Medications   diltiazem (CARDIZEM CD) 120 MG 24 hr capsule   Other Relevant Orders   Lipid panel    Return in about 6 weeks (around 08/29/2022) for Reassessment.   Haydee Salter, MD

## 2022-07-18 NOTE — Assessment & Plan Note (Signed)
I feel it would be appropriate to restart Alejandro Flores on sertraline. We will taper up slowly and I will see him back in 6 weeks to reassess.

## 2022-07-18 NOTE — Assessment & Plan Note (Signed)
Rate is normal today. I will restart his diltiazem

## 2022-07-18 NOTE — Assessment & Plan Note (Signed)
Mr. Alejandro Flores characterizes this as drinking to excess. However, review of his prior records indicates a past issue with alcohol abuse/addiction. I support his efforts at long-term sobriety. I do think we should be very cautious about considering use of a BZD,a s he would be at risk for developing addiction to this.

## 2022-07-18 NOTE — Assessment & Plan Note (Signed)
I will check a uric acid level today to determine if he would benefit from a uricosuric.

## 2022-07-18 NOTE — Assessment & Plan Note (Signed)
Regular vaping. He should try to stop this in light of his hypertension.

## 2022-07-18 NOTE — Assessment & Plan Note (Signed)
Blood pressure is mildly elevated today. We will restart his diltiazem. I will check his renal function and electrolytes.

## 2022-07-18 NOTE — Assessment & Plan Note (Signed)
I will check fasting lipids today. 

## 2022-07-20 ENCOUNTER — Encounter: Payer: Self-pay | Admitting: Family Medicine

## 2022-07-20 DIAGNOSIS — M1A9XX0 Chronic gout, unspecified, without tophus (tophi): Secondary | ICD-10-CM

## 2022-07-20 DIAGNOSIS — E782 Mixed hyperlipidemia: Secondary | ICD-10-CM

## 2022-07-21 MED ORDER — ATORVASTATIN CALCIUM 20 MG PO TABS: 20.0000 mg | ORAL_TABLET | Freq: Every day | ORAL | 3 refills | Status: DC

## 2022-07-21 MED ORDER — ALLOPURINOL 100 MG PO TABS: 100.0000 mg | ORAL_TABLET | Freq: Every day | ORAL | 6 refills | Status: DC

## 2022-07-24 DIAGNOSIS — Z419 Encounter for procedure for purposes other than remedying health state, unspecified: Secondary | ICD-10-CM | POA: Diagnosis not present

## 2022-08-22 DIAGNOSIS — Z419 Encounter for procedure for purposes other than remedying health state, unspecified: Secondary | ICD-10-CM | POA: Diagnosis not present

## 2022-09-22 DIAGNOSIS — Z419 Encounter for procedure for purposes other than remedying health state, unspecified: Secondary | ICD-10-CM | POA: Diagnosis not present

## 2022-10-22 DIAGNOSIS — Z419 Encounter for procedure for purposes other than remedying health state, unspecified: Secondary | ICD-10-CM | POA: Diagnosis not present

## 2022-11-22 DIAGNOSIS — Z419 Encounter for procedure for purposes other than remedying health state, unspecified: Secondary | ICD-10-CM | POA: Diagnosis not present

## 2022-12-22 DIAGNOSIS — Z419 Encounter for procedure for purposes other than remedying health state, unspecified: Secondary | ICD-10-CM | POA: Diagnosis not present

## 2023-01-22 DIAGNOSIS — Z419 Encounter for procedure for purposes other than remedying health state, unspecified: Secondary | ICD-10-CM | POA: Diagnosis not present

## 2023-02-22 DIAGNOSIS — Z419 Encounter for procedure for purposes other than remedying health state, unspecified: Secondary | ICD-10-CM | POA: Diagnosis not present

## 2023-03-03 DIAGNOSIS — E6609 Other obesity due to excess calories: Secondary | ICD-10-CM | POA: Diagnosis not present

## 2023-03-03 DIAGNOSIS — R9431 Abnormal electrocardiogram [ECG] [EKG]: Secondary | ICD-10-CM | POA: Diagnosis not present

## 2023-03-03 DIAGNOSIS — Z6834 Body mass index (BMI) 34.0-34.9, adult: Secondary | ICD-10-CM | POA: Diagnosis not present

## 2023-03-03 DIAGNOSIS — Z0000 Encounter for general adult medical examination without abnormal findings: Secondary | ICD-10-CM | POA: Diagnosis not present

## 2023-03-10 DIAGNOSIS — E7800 Pure hypercholesterolemia, unspecified: Secondary | ICD-10-CM | POA: Diagnosis not present

## 2023-03-10 DIAGNOSIS — Z1159 Encounter for screening for other viral diseases: Secondary | ICD-10-CM | POA: Diagnosis not present

## 2023-03-24 DIAGNOSIS — Z419 Encounter for procedure for purposes other than remedying health state, unspecified: Secondary | ICD-10-CM | POA: Diagnosis not present

## 2023-04-24 DIAGNOSIS — Z419 Encounter for procedure for purposes other than remedying health state, unspecified: Secondary | ICD-10-CM | POA: Diagnosis not present

## 2023-05-24 DIAGNOSIS — Z419 Encounter for procedure for purposes other than remedying health state, unspecified: Secondary | ICD-10-CM | POA: Diagnosis not present

## 2023-06-24 DIAGNOSIS — Z419 Encounter for procedure for purposes other than remedying health state, unspecified: Secondary | ICD-10-CM | POA: Diagnosis not present

## 2023-07-10 ENCOUNTER — Other Ambulatory Visit: Payer: Self-pay | Admitting: Family Medicine

## 2023-07-10 DIAGNOSIS — I471 Supraventricular tachycardia, unspecified: Secondary | ICD-10-CM

## 2023-07-10 DIAGNOSIS — I10 Essential (primary) hypertension: Secondary | ICD-10-CM

## 2023-07-13 NOTE — Telephone Encounter (Signed)
Lft VM to rtn call. Dm/cma  

## 2023-07-14 NOTE — Telephone Encounter (Signed)
Lft VM to rtn call. Dm/cma  

## 2023-07-15 NOTE — Telephone Encounter (Signed)
Lft VM to rtn call. Dm/cma  

## 2023-07-25 DIAGNOSIS — Z419 Encounter for procedure for purposes other than remedying health state, unspecified: Secondary | ICD-10-CM | POA: Diagnosis not present

## 2023-08-04 ENCOUNTER — Other Ambulatory Visit: Payer: Self-pay | Admitting: Family Medicine

## 2023-08-04 DIAGNOSIS — I471 Supraventricular tachycardia, unspecified: Secondary | ICD-10-CM

## 2023-08-04 DIAGNOSIS — I10 Essential (primary) hypertension: Secondary | ICD-10-CM

## 2023-08-22 DIAGNOSIS — Z419 Encounter for procedure for purposes other than remedying health state, unspecified: Secondary | ICD-10-CM | POA: Diagnosis not present

## 2023-09-01 ENCOUNTER — Other Ambulatory Visit: Payer: Self-pay | Admitting: Medical Genetics

## 2023-09-01 DIAGNOSIS — R0602 Shortness of breath: Secondary | ICD-10-CM | POA: Diagnosis not present

## 2023-09-01 DIAGNOSIS — E7800 Pure hypercholesterolemia, unspecified: Secondary | ICD-10-CM | POA: Diagnosis not present

## 2023-09-01 DIAGNOSIS — I4891 Unspecified atrial fibrillation: Secondary | ICD-10-CM | POA: Diagnosis not present

## 2023-09-01 DIAGNOSIS — R5383 Other fatigue: Secondary | ICD-10-CM | POA: Diagnosis not present

## 2023-09-08 ENCOUNTER — Encounter: Payer: Self-pay | Admitting: Family Medicine

## 2023-09-08 ENCOUNTER — Ambulatory Visit: Attending: Family Medicine

## 2023-09-08 ENCOUNTER — Ambulatory Visit (INDEPENDENT_AMBULATORY_CARE_PROVIDER_SITE_OTHER): Payer: Self-pay | Admitting: Family Medicine

## 2023-09-08 VITALS — BP 118/76 | HR 87 | Temp 98.9°F | Ht 70.0 in | Wt 230.8 lb

## 2023-09-08 DIAGNOSIS — I10 Essential (primary) hypertension: Secondary | ICD-10-CM

## 2023-09-08 DIAGNOSIS — R002 Palpitations: Secondary | ICD-10-CM

## 2023-09-08 DIAGNOSIS — M1A9XX0 Chronic gout, unspecified, without tophus (tophi): Secondary | ICD-10-CM | POA: Diagnosis not present

## 2023-09-08 DIAGNOSIS — Z8679 Personal history of other diseases of the circulatory system: Secondary | ICD-10-CM

## 2023-09-08 DIAGNOSIS — E782 Mixed hyperlipidemia: Secondary | ICD-10-CM | POA: Diagnosis not present

## 2023-09-08 DIAGNOSIS — I471 Supraventricular tachycardia, unspecified: Secondary | ICD-10-CM

## 2023-09-08 LAB — TSH: TSH: 1.74 u[IU]/mL (ref 0.35–5.50)

## 2023-09-08 LAB — CBC
HCT: 46.7 % (ref 39.0–52.0)
Hemoglobin: 16.1 g/dL (ref 13.0–17.0)
MCHC: 34.6 g/dL (ref 30.0–36.0)
MCV: 87.7 fl (ref 78.0–100.0)
Platelets: 226 K/uL (ref 150.0–400.0)
RBC: 5.33 Mil/uL (ref 4.22–5.81)
RDW: 13.4 % (ref 11.5–15.5)
WBC: 8.7 K/uL (ref 4.0–10.5)

## 2023-09-08 LAB — LIPID PANEL
Cholesterol: 240 mg/dL — ABNORMAL HIGH (ref 0–200)
HDL: 30.2 mg/dL — ABNORMAL LOW
LDL Cholesterol: 150 mg/dL — ABNORMAL HIGH (ref 0–99)
NonHDL: 209.61
Total CHOL/HDL Ratio: 8
Triglycerides: 296 mg/dL — ABNORMAL HIGH (ref 0.0–149.0)
VLDL: 59.2 mg/dL — ABNORMAL HIGH (ref 0.0–40.0)

## 2023-09-08 LAB — URIC ACID: Uric Acid, Serum: 9.1 mg/dL — ABNORMAL HIGH (ref 4.0–7.8)

## 2023-09-08 LAB — BASIC METABOLIC PANEL WITH GFR
BUN: 7 mg/dL (ref 6–23)
CO2: 26 meq/L (ref 19–32)
Calcium: 9.6 mg/dL (ref 8.4–10.5)
Chloride: 103 meq/L (ref 96–112)
Creatinine, Ser: 0.84 mg/dL (ref 0.40–1.50)
GFR: 106.42 mL/min
Glucose, Bld: 96 mg/dL (ref 70–99)
Potassium: 4.1 meq/L (ref 3.5–5.1)
Sodium: 138 meq/L (ref 135–145)

## 2023-09-08 NOTE — Progress Notes (Signed)
 [facility] [address] [place] [address] Dept: [phone] Dept Fax: [phone]  Chronic Care Office Visit  Subjective:    Patient ID: Alejandro Flores, male    DOB: [birth year], 43 y.o..   MRN: [id number]  Chief Complaint  Patient presents with   Follow-up    F/u chol. Fasting today.  ? Cardiology referral.    History of Present Illness:  Patient is in today for reassessment of chronic medical issues.  Mr. Guerrette has a history of hypertension and an SVT. He is currently managed on diltiazem 120 mg daily. He does note that for the past 3 months, he has had more frequent episodes of palpitations. He associates the feeling he has with this as being similar to when he had an issue with atrial fibrillation in his 30s. He notes some chest tightness at times, a fluttery sensation in the chest, pain in either arm, and a burning sensation in his hid upper back.    Mr. Eastridge has a history of hyperlipidemia. He had been being seen at a different practice over the past year due to an insurance issue. He notes he was taken off of his statin and encouraged to follow a more holistic approach. He did engage in following a Mediterranean diet based on advice from me. He has been able to lose 31 lbs over the past 14 months.  Past Medical History: Patient Active Problem List   Diagnosis Date Noted   History of atrial fibrillation 09/08/2023   History of pancreatitis 07/18/2022   SVT (supraventricular tachycardia) (HCC) 07/16/2022   Prolonged QT interval 11/29/2020   Class 1 obesity due to excess calories with body mass index (BMI) of 33.0 to 33.9 in adult 11/29/2020   Marijuana abuse in remission 11/29/2020   Nicotine dependence 11/29/2020   Motor tic disorder 03/13/2017   Tourette syndrome 01/03/2016   Essential hypertension 01/03/2016   Hyperlipidemia 01/03/2016   Alcoholism in remission (HCC) 08/06/2015   Paroxysmal atrial fibrillation  (HCC) 08/06/2015   Anxiety with depression 08/06/2015   Panic disorder 08/06/2015   Gout 08/06/2015   Kidney stones 08/06/2015   Hypomagnesemia 01/26/2013   Hypokalemia 01/26/2013   Past Surgical History:  Procedure Laterality Date   BIOPSY  12/01/2020   Procedure: BIOPSY;  Surgeon: Meryl Dare, MD;  Location: [facility] ENDOSCOPY;  Service: Endoscopy;;   colonoscopy with polypectomy  2016   [facility]--had rectal bleeding and one was precancerous   ESOPHAGOGASTRODUODENOSCOPY (EGD) WITH PROPOFOL N/A 12/01/2020   Procedure: ESOPHAGOGASTRODUODENOSCOPY (EGD) WITH PROPOFOL;  Surgeon: Meryl Dare, MD;  Location: [facility] ENDOSCOPY;  Service: Endoscopy;  Laterality: N/A;   Family History  Problem Relation Age of Onset   Heart disease Mother    Cancer Mother        Non-small cell lung cancer   Breast cancer Mother 70   Hyperlipidemia Mother    Anxiety disorder Mother    Stroke Father    Alcohol abuse Father    Heart disease Father 79       Triple bypass    COPD Father        chronic smoker   Alcohol abuse Brother    Depression Brother    Seizures Brother    Cancer Maternal Aunt        Breast   Cancer Maternal Grandmother        blood cancer   Diabetes Paternal Grandmother    Stroke Paternal Grandmother  Outpatient Medications Prior to Visit  Medication Sig Dispense Refill   acetaminophen (TYLENOL) 500 MG tablet Take 500 mg by mouth every 6 (six) hours as needed.     ALPRAZolam (XANAX) 1 MG tablet Take 1 mg by mouth 2 (two) times daily as needed.     atorvastatin (LIPITOR) 20 MG tablet Take 20 mg by mouth daily.     diltiazem (CARDIZEM CD) 120 MG 24 hr capsule TAKE 1 CAPSULE BY MOUTH EVERY DAY 90 capsule 0   Multiple Vitamins-Minerals (DAILY MULTIVITAMIN) CAPS Take 1 capsule by mouth daily.     pantoprazole (PROTONIX) 40 MG tablet Take 40 mg by mouth daily. (Patient not taking: Reported on 09/08/2023)     allopurinol (ZYLOPRIM) 100 MG tablet Take 1 tablet (100 mg total)  by mouth daily. 30 tablet 6   atorvastatin (LIPITOR) 20 MG tablet Take 1 tablet (20 mg total) by mouth daily. 90 tablet 3   sertraline (ZOLOFT) 50 MG tablet Take 0.5 tablets (25 mg total) by mouth daily for 14 days, THEN 1 tablet (50 mg total) daily for 14 days, THEN 2 tablets (100 mg total) daily 30 tablet 3   No facility-administered medications prior to visit.   Allergies  Allergen Reactions   Morphine Itching and Rash    "hives"   Objective:   Today's Vitals   09/08/23 1049  BP: 118/76  Pulse: 87  Temp: 98.9 F (37.2 C)  TempSrc: Temporal  SpO2: 98%  Weight: 230 lb 12.8 oz (104.7 kg)  Height: 5\' 10"  (1.778 m)   Body mass index is 33.12 kg/m.   General: Well developed, well nourished. No acute distress. CV: RRR without murmurs or rubs. Pulses 2+ bilaterally. Psych: Alert and oriented. Normal mood and affect.  Health Maintenance Due  Topic Date Due   Hepatitis C Screening  Never done   Pneumococcal Vaccine 73-74 Years old (2 of 2 - PCV) 10/14/2014   INFLUENZA VACCINE  01/22/2023     Assessment & Plan:   Problem List Items Addressed This Visit       Cardiovascular and Mediastinum   Essential hypertension   Blood pressure is at goal today. Continue diltiazem. I will check his renal function and electrolytes.      Relevant Medications   atorvastatin (LIPITOR) 20 MG tablet   Other Relevant Orders   Basic metabolic panel   SVT (supraventricular tachycardia) (HCC)   Rate is normal today. Continue diltiazem CD 120 mg daily.      Relevant Medications   atorvastatin (LIPITOR) 20 MG tablet   Other Relevant Orders   Basic metabolic panel     Other   Gout   I will check a uric acid level today, as this was elevated in the past. He notes no gout attacks since he has followed an improved diet.      Relevant Orders   Uric acid   History of atrial fibrillation   Relevant Orders   Basic metabolic panel   LONG TERM MONITOR (3-14 DAYS)   Hyperlipidemia   I will  check lipids today. Mr. Spicher weight loss may have led to some reduction in his lipid levels.      Relevant Medications   atorvastatin (LIPITOR) 20 MG tablet   Other Relevant Orders   Lipid panel   Other Visit Diagnoses       Palpitations    -  Primary   Etiology is uncertain. Episodes are occuring every other day most recently. This could represent  either his SVT or a return of a. fib. I will order a Zio patch.   Relevant Orders   Basic metabolic panel   LONG TERM MONITOR (3-14 DAYS)   TSH   CBC       Return for Follow-up after testing/imaging.   Loyola Mast, MD

## 2023-09-08 NOTE — Assessment & Plan Note (Signed)
 I will check a uric acid level today, as this was elevated in the past. He notes no gout attacks since he has followed an improved diet.

## 2023-09-08 NOTE — Assessment & Plan Note (Signed)
 I will check lipids today. Alejandro Flores weight loss may have led to some reduction in his lipid levels.

## 2023-09-08 NOTE — Progress Notes (Unsigned)
 EP to read.

## 2023-09-08 NOTE — Assessment & Plan Note (Signed)
 Blood pressure is at goal today. Continue diltiazem. I will check his renal function and electrolytes.

## 2023-09-08 NOTE — Assessment & Plan Note (Signed)
 Rate is normal today. Continue diltiazem CD 120 mg daily.

## 2023-09-14 ENCOUNTER — Other Ambulatory Visit

## 2023-10-01 DIAGNOSIS — R002 Palpitations: Secondary | ICD-10-CM

## 2023-10-02 ENCOUNTER — Encounter: Payer: Self-pay | Admitting: Family Medicine

## 2023-10-03 DIAGNOSIS — Z419 Encounter for procedure for purposes other than remedying health state, unspecified: Secondary | ICD-10-CM | POA: Diagnosis not present

## 2023-11-02 DIAGNOSIS — Z419 Encounter for procedure for purposes other than remedying health state, unspecified: Secondary | ICD-10-CM | POA: Diagnosis not present

## 2023-12-03 DIAGNOSIS — Z419 Encounter for procedure for purposes other than remedying health state, unspecified: Secondary | ICD-10-CM | POA: Diagnosis not present

## 2024-01-02 DIAGNOSIS — Z419 Encounter for procedure for purposes other than remedying health state, unspecified: Secondary | ICD-10-CM | POA: Diagnosis not present

## 2024-01-20 ENCOUNTER — Encounter: Payer: Self-pay | Admitting: Family Medicine

## 2024-01-20 ENCOUNTER — Ambulatory Visit (INDEPENDENT_AMBULATORY_CARE_PROVIDER_SITE_OTHER): Admitting: Adult Health

## 2024-01-20 ENCOUNTER — Telehealth: Admitting: Physician Assistant

## 2024-01-20 VITALS — BP 106/80 | HR 107 | Temp 98.4°F | Ht 70.0 in | Wt 218.0 lb

## 2024-01-20 DIAGNOSIS — M2550 Pain in unspecified joint: Secondary | ICD-10-CM

## 2024-01-20 DIAGNOSIS — M1A9XX0 Chronic gout, unspecified, without tophus (tophi): Secondary | ICD-10-CM

## 2024-01-20 LAB — URIC ACID: Uric Acid, Serum: 7.5 mg/dL (ref 4.0–7.8)

## 2024-01-20 MED ORDER — PREDNISONE 20 MG PO TABS: 20.0000 mg | ORAL_TABLET | Freq: Every day | ORAL | 0 refills | Status: AC

## 2024-01-20 NOTE — Progress Notes (Signed)
  Because of how long symptoms have been occurring, multiple joints on multiple extremities affected, and inability to walk properly, I am concerned something more significant is going on. You need a detailed examination and possible further workup. As such, I feel your condition warrants further evaluation and I recommend that you be seen in an in-person visit with your primary care provider or at a local urgent care/ER as soon as possible.    NOTE: There will be NO CHARGE for this E-Visit   If you are having a true medical emergency, please call 911.     For an urgent face to face visit, [facility] has multiple urgent care centers for your convenience.  Click the link below for the full list of locations and hours, walk-in wait times, appointment scheduling options and driving directions:  Urgent Care - [place], [place], [place], [place], [place], [place]  [place]     Your MyChart E-visit questionnaire answers were reviewed by a board certified advanced clinical practitioner to complete your personal care plan based on your specific symptoms.    Thank you for using e-Visits.

## 2024-01-20 NOTE — Patient Instructions (Signed)
 I have sent in prednisone  to help with he gout flare  We will check a uric acid level on you today

## 2024-01-20 NOTE — Progress Notes (Signed)
 Subjective:    Patient ID: Alejandro Flores, male    DOB: 20-May-1981, 43 y.o.   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  He is a patient of Dr. Thedora who I am seeing today for an acute visit. He is concerned about an acute gout flare.  He has known history of gout.  Last uric acid level was in March 2025 with a level of 9.1.  He had changed his diet to the Mediterranean diet to help prevent gout flares and lower his cholesterol but over the last 1 to 2 weeks he has been eating salmon and fresh fruit without knowing if purine content of these foods.  His most recent gout flare has been present for roughly a week. It is constantly painful.   Similar to previous gout flares and then some the same area.  On his right foot he has swelling to his great toe with erythema and warmth as well as swelling erythema and warmth around his little toe up into the dorsal aspect of his  foot.  He has also started to develop pain swelling and redness in his left little toe  No calf pain or tenderness noted.   Review of Systems See HPI   Past Medical History:  Diagnosis Date   Anxiety    Atrial fibrillation (HCC)    Appears associated with alcohol abuse and withdrawal   Depression    Has a counselor at [employer].   ETOH abuse 06/24/2011   Relates to separation/divorce    Gout 2011-2012   Pancreatic necrosis 09/21/2012   Pancreatic pseudocyst/cyst    Spring of 2015 per patient- states at [facility]    Social History   Socioeconomic History   Marital status: Significant Other    Spouse name: Not on file   Number of children: 0   Years of education: Not on file   Highest education level: Associate degree: academic program  Occupational History   Occupation: Food Delivery  Tobacco Use   Smoking status:  Former    Current packs/day: 0.00    Average packs/day: 0.5 packs/day for 4.0 years (2.0 ttl pk-yrs)    Types: Cigarettes    Start date: 2016    Quit date: 2020    Years since quitting: 5.5   Smokeless tobacco: Never  Vaping Use   Vaping status: Every Day   Substances: Nicotine   Substance and Sexual Activity   Alcohol use: Not Currently    Comment: Close to 200 days ago with treatment at [facility] and [facility] and alcohol free.  Going 2-3 times to AA weekly   Drug use: Not Currently    Types: Marijuana    Comment: MJ in college   Sexual activity: Not Currently  Other Topics Concern   Not on file  Social History Narrative   Lives with mother, who has cancer   Social Drivers of Health   Financial Resource Strain: Patient Declined (01/20/2024)   Overall Financial Resource Strain (CARDIA)    Difficulty of Paying Living Expenses: Patient declined  Food Insecurity: No Food Insecurity (01/20/2024)   Hunger Vital Sign    Worried About Running Out of Food in the Last Year: Never true    Ran Out of Food in the Last Year: Never true  Transportation Needs: No Transportation Needs (01/20/2024)   PRAPARE -  Administrator, Civil Service (Medical): No    Lack of Transportation (Non-Medical): No  Physical Activity: Sufficiently Active (01/20/2024)   Exercise Vital Sign    Days of Exercise per Week: 5 days    Minutes of Exercise per Session: 30 min  Stress: Stress Concern Present (01/20/2024)   [employer] of Occupational Health - Occupational Stress Questionnaire    Feeling of Stress: To some extent  Social Connections: Moderately Isolated (01/20/2024)   Social Connection and Isolation Panel    Frequency of Communication with Friends and Family: More than three times a week    Frequency of Social Gatherings with Friends and Family: More than three times a week    Attends Religious Services: Never    Database administrator or Organizations: No    Attends Museum/gallery exhibitions officer: Not on file    Marital Status: Living with partner  Intimate Partner Violence: Not on file    Past Surgical History:  Procedure Laterality Date   BIOPSY  12/01/2020   Procedure: BIOPSY;  Surgeon: Aneita Gwendlyn DASEN, MD;  Location: [facility] ENDOSCOPY;  Service: Endoscopy;;   colonoscopy with polypectomy  2016   [facility]--had rectal bleeding and one was precancerous   ESOPHAGOGASTRODUODENOSCOPY (EGD) WITH PROPOFOL  N/A 12/01/2020   Procedure: ESOPHAGOGASTRODUODENOSCOPY (EGD) WITH PROPOFOL ;  Surgeon: Aneita Gwendlyn DASEN, MD;  Location: [facility] ENDOSCOPY;  Service: Endoscopy;  Laterality: N/A;    Family History  Problem Relation Age of Onset   Heart disease Mother    Cancer Mother        Non-small cell lung cancer   Breast cancer Mother 33   Hyperlipidemia Mother    Anxiety disorder Mother    Stroke Father    Alcohol abuse Father    Heart disease Father 30       Triple bypass    COPD Father        chronic smoker   Alcohol abuse Brother    Depression Brother    Seizures Brother    Cancer Maternal Aunt        Breast   Cancer Maternal Grandmother        blood cancer   Diabetes Paternal Grandmother    Stroke Paternal Grandmother     Allergies  Allergen Reactions   Morphine  Itching and Rash    hives    Current Outpatient Medications on File Prior to Visit  Medication Sig Dispense Refill   acetaminophen  (TYLENOL ) 500 MG tablet Take 500 mg by mouth every 6 (six) hours as needed.     ALPRAZolam  (XANAX ) 1 MG tablet Take 1 mg by mouth 2 (two) times daily as needed.     atorvastatin  (LIPITOR) 20 MG tablet Take 20 mg by mouth daily.     diltiazem  (CARDIZEM  CD) 120 MG 24 hr capsule TAKE 1 CAPSULE BY MOUTH EVERY DAY 90 capsule 0   Multiple Vitamins-Minerals (DAILY MULTIVITAMIN) CAPS Take 1 capsule by mouth daily.     pantoprazole  (PROTONIX ) 40 MG tablet Take 40 mg by mouth daily. (Patient not taking: Reported on 09/08/2023)     No current facility-administered  medications on file prior to visit.    BP 106/80   Pulse (!) 107   Temp 98.4 F (36.9 C) (Oral)   Ht 5' 10 (1.778 m)   Wt 218 lb (98.9 kg)   SpO2 98%   BMI 31.28 kg/m       Objective:   Physical Exam Vitals and nursing note  reviewed.  Constitutional:      Appearance: Normal appearance.  Musculoskeletal:       Feet:  Skin:    General: Skin is warm and dry.     Findings: Erythema present.  Neurological:     General: No focal deficit present.     Mental Status: He is oriented to person, place, and time.  Psychiatric:        Mood and Affect: Mood normal.        Behavior: Behavior normal.        Thought Content: Thought content normal.        Judgment: Judgment normal.       Assessment & Plan:  1. Chronic gout without tophus, unspecified cause, unspecified site (Primary) - Start on prednisone  therapy and check uric acid level today.  Advised follow-up with PCP in the next week as he likely will need to be placed on allopurinol . - predniSONE  (DELTASONE ) 20 MG tablet; Take 1 tablet (20 mg total) by mouth daily with breakfast.  Dispense: 7 tablet; Refill: 0 - Uric Acid; Future - Uric Acid  Darleene Shape, NP

## 2024-01-21 ENCOUNTER — Ambulatory Visit: Payer: Self-pay | Admitting: Adult Health

## 2024-02-02 DIAGNOSIS — Z419 Encounter for procedure for purposes other than remedying health state, unspecified: Secondary | ICD-10-CM | POA: Diagnosis not present

## 2024-02-16 ENCOUNTER — Ambulatory Visit (INDEPENDENT_AMBULATORY_CARE_PROVIDER_SITE_OTHER): Admitting: Family Medicine

## 2024-02-16 ENCOUNTER — Encounter: Payer: Self-pay | Admitting: Family Medicine

## 2024-02-16 VITALS — BP 124/78 | HR 84 | Temp 97.4°F | Ht 70.0 in | Wt 220.8 lb

## 2024-02-16 DIAGNOSIS — I10 Essential (primary) hypertension: Secondary | ICD-10-CM

## 2024-02-16 DIAGNOSIS — I471 Supraventricular tachycardia, unspecified: Secondary | ICD-10-CM

## 2024-02-16 DIAGNOSIS — M10079 Idiopathic gout, unspecified ankle and foot: Secondary | ICD-10-CM | POA: Diagnosis not present

## 2024-02-16 MED ORDER — PREDNISONE 20 MG PO TABS: 40.0000 mg | ORAL_TABLET | Freq: Every day | ORAL | 0 refills | Status: AC

## 2024-02-16 MED ORDER — DILTIAZEM HCL ER COATED BEADS 120 MG PO CP24: 120.0000 mg | ORAL_CAPSULE | Freq: Every day | ORAL | 3 refills | Status: AC

## 2024-02-16 NOTE — Assessment & Plan Note (Signed)
 Rate is normal today. Continue diltiazem CD 120 mg daily.

## 2024-02-16 NOTE — Assessment & Plan Note (Signed)
 Recent flare resolved. I will provide him with a course of prednisone  for him to keep on hand, should he have another recurrence. Once he has symptoms consistent with a gout attack, he may start this prednisone  course and plan to follow-up with me.

## 2024-02-16 NOTE — Assessment & Plan Note (Signed)
 Blood pressure is at goal today. Continue diltiazem  CD 120 mg daily.

## 2024-02-16 NOTE — Progress Notes (Signed)
 [facility] [address] [place] [address] Dept: [phone] Dept Fax: [phone]  Office Visit  Subjective:    Patient ID: Alejandro Flores, male    DOB: 01/26/1981, 43 y.o..   MRN: [id number]  Chief Complaint  Patient presents with   Gout    1 month f/u in RT & LT foot.    History of Present Illness:  Patient is in today for follow-up regarding a recent gout attack. Alejandro Flores notes that he started with right lateral foot pain about 3 weeks ago. This progressed ot pain int he right 1st MTP joint associated with redness and swelling. The pain was significant enough to cause him difficulty with walking. It then progressed to his left lateral and mid foot. Finally, he also developed swelling and pain in his right ankle. He was seen by Alejandro Flores on 7/30 and placed on a 5-day course of prednisone . This has contributed to resolution of his issues. His last gout attack had been last year. He feels this may have been triggered by a regular increase in his intake of salmon.  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 ENDOSCOPY;  Service: Endoscopy;;   COLON SURGERY     colonoscopy with polypectomy  06/23/2014   [facility]--had rectal bleeding and one was precancerous   ESOPHAGOGASTRODUODENOSCOPY (EGD) WITH PROPOFOL  N/A 12/01/2020   Procedure: ESOPHAGOGASTRODUODENOSCOPY (EGD) WITH PROPOFOL ;  Surgeon: Aneita Gwendlyn DASEN, MD;  Location: [facility] ENDOSCOPY;  Service: Endoscopy;  Laterality: N/A;   Family History  Problem Relation Age of Onset   Heart disease Mother    Cancer Mother        Non-small cell lung cancer   Breast cancer Mother 50   Hyperlipidemia Mother    Anxiety disorder Mother    Stroke Father    Alcohol abuse Father    Heart disease Father 92       Triple bypass    COPD Father        chronic smoker   Alcohol abuse Brother    Depression Brother    Seizures Brother    Cancer Maternal Aunt        Breast   Cancer Maternal Grandmother        blood cancer   Diabetes Paternal Grandmother    Stroke Paternal Grandmother    Outpatient Medications Prior to Visit  Medication Sig Dispense Refill   acetaminophen  (TYLENOL ) 500 MG tablet Take 500 mg by mouth every 6 (six) hours as needed.     ALPRAZolam  (XANAX ) 1 MG tablet Take 1 mg by mouth 2 (two) times  daily as needed.     Multiple Vitamins-Minerals (DAILY MULTIVITAMIN) CAPS Take 1 capsule by mouth daily.     predniSONE  (DELTASONE ) 20 MG tablet Take 1 tablet (20 mg total) by mouth daily with breakfast. 7 tablet 0   diltiazem  (CARDIZEM  CD) 120 MG 24 hr capsule TAKE 1 CAPSULE BY MOUTH EVERY DAY 90 capsule 0   atorvastatin  (LIPITOR) 20 MG tablet Take 20 mg by mouth daily.     pantoprazole  (PROTONIX ) 40 MG tablet Take 40 mg by mouth daily. (Patient not taking: Reported on 09/08/2023)     No facility-administered medications prior to visit.   Allergies  Allergen Reactions   Morphine  Itching and Rash    hives     Objective:   Today's Vitals   02/16/24 1420  BP: 124/78   Pulse: 84  Temp: (!) 97.4 F (36.3 C)  TempSrc: Temporal  SpO2: 98%  Weight: 220 lb 12.8 oz (100.2 kg)  Height: 5' 10 (1.778 m)   Body mass index is 31.68 kg/m.   General: Well developed, well nourished. No acute distress. Extremities: Normal appearing right foot and ankle without any swelling present Psych: Alert and oriented. Normal mood and affect.  Health Maintenance Due  Topic Date Due   Hepatitis C Screening  Never done   Hepatitis B Vaccines 19-59 Average Risk (1 of 3 - 19+ 3-dose series) Never done   HPV VACCINES (1 - 3-dose SCDM series) Never done   Pneumococcal Vaccine (2 of 2 - PCV) 10/14/2014   Lab Results Lab Results  Component Value Date   LABURIC 7.5 01/20/2024     Assessment & Plan:   Problem List Items Addressed This Visit       Cardiovascular and Mediastinum   Essential hypertension   Blood pressure is at goal today. Continue diltiazem  CD 120 mg daily.      Relevant Medications   diltiazem  (CARDIZEM  CD) 120 MG 24 hr capsule   SVT (supraventricular tachycardia) (HCC)   Rate is normal today. Continue diltiazem  CD 120 mg daily.      Relevant Medications   diltiazem  (CARDIZEM  CD) 120 MG 24 hr capsule     Other   Gout - Primary   Recent flare resolved. I will provide him with a course of prednisone  for him to keep on hand, should he have another recurrence. Once he has symptoms consistent with a gout attack, he may start this prednisone  course and plan to follow-up with me.      Relevant Medications   predniSONE  (DELTASONE ) 20 MG tablet    Return in about 3 months (around 05/18/2024) for Reassessment.   Garnette CHRISTELLA Simpler, MD

## 2024-02-24 ENCOUNTER — Telehealth: Payer: Self-pay

## 2024-02-24 NOTE — Telephone Encounter (Signed)
 Copied from CRM [id number]. Topic: General - Other >> Feb 24, 2024  9:17 AM Rosina BIRCH wrote: Reason for CRM: patient called wanting more information on the recent covid vaccination and if he need a prescription  CB [phone]

## 2024-02-24 NOTE — Telephone Encounter (Signed)
 Spoke to patient advised him that we were told that it is not recommended for patients under 65 or if certain health conditions.  We don't provide RX for covid shots per management. Dm/cma

## 2024-03-04 DIAGNOSIS — Z23 Encounter for immunization: Secondary | ICD-10-CM | POA: Diagnosis not present

## 2024-03-04 DIAGNOSIS — Z419 Encounter for procedure for purposes other than remedying health state, unspecified: Secondary | ICD-10-CM | POA: Diagnosis not present

## 2024-04-03 DIAGNOSIS — Z419 Encounter for procedure for purposes other than remedying health state, unspecified: Secondary | ICD-10-CM | POA: Diagnosis not present

## 2024-04-13 ENCOUNTER — Other Ambulatory Visit: Payer: Self-pay | Admitting: Medical Genetics

## 2024-04-13 DIAGNOSIS — Z006 Encounter for examination for normal comparison and control in clinical research program: Secondary | ICD-10-CM

## 2024-05-04 DIAGNOSIS — Z419 Encounter for procedure for purposes other than remedying health state, unspecified: Secondary | ICD-10-CM | POA: Diagnosis not present

## 2024-05-30 LAB — GENECONNECT MOLECULAR SCREEN: Genetic Analysis Overall Interpretation: POSITIVE — AB

## 2024-06-01 ENCOUNTER — Telehealth: Payer: Self-pay | Admitting: Medical Genetics

## 2024-06-01 DIAGNOSIS — Z1589 Genetic susceptibility to other disease: Secondary | ICD-10-CM | POA: Insufficient documentation

## 2024-06-01 NOTE — Telephone Encounter (Signed)
  Gene[employer] Positive Result Note 06/01/2024 1:08 PM  FIRST ATTEMPT: Confirmed I was speaking with Alyce Darryle Hurst [id number] by using name and DOB. Informed participant the reason for this call is to provide results for the above study. Results revealed Hereditary Breast and Ovarian Syndrome. Genetic counseling was offered and participant declined. All questions were answered, and participant was thanked for their time and support of the above study. Participant was encouraged to contact [facility] if they have any further questions or concerns.

## 2024-06-03 DIAGNOSIS — Z419 Encounter for procedure for purposes other than remedying health state, unspecified: Secondary | ICD-10-CM | POA: Diagnosis not present
